# Patient Record
Sex: Female | Born: 1970 | Race: Black or African American | Hispanic: No | Smoking: Never smoker
Health system: Southern US, Community
[De-identification: ages and names within clinical notes are randomized; demographics above are authoritative.]

## PROBLEM LIST (undated history)

## (undated) DIAGNOSIS — T7840XA Allergy, unspecified, initial encounter: Secondary | ICD-10-CM

## (undated) DIAGNOSIS — C801 Malignant (primary) neoplasm, unspecified: Secondary | ICD-10-CM

## (undated) DIAGNOSIS — G43909 Migraine, unspecified, not intractable, without status migrainosus: Secondary | ICD-10-CM

## (undated) DIAGNOSIS — F32A Depression, unspecified: Secondary | ICD-10-CM

## (undated) DIAGNOSIS — D649 Anemia, unspecified: Secondary | ICD-10-CM

## (undated) DIAGNOSIS — F419 Anxiety disorder, unspecified: Secondary | ICD-10-CM

## (undated) DIAGNOSIS — K219 Gastro-esophageal reflux disease without esophagitis: Secondary | ICD-10-CM

## (undated) DIAGNOSIS — F329 Major depressive disorder, single episode, unspecified: Secondary | ICD-10-CM

## (undated) HISTORY — DX: Depression, unspecified: F32.A

## (undated) HISTORY — DX: Anemia, unspecified: D64.9

## (undated) HISTORY — DX: Anxiety disorder, unspecified: F41.9

## (undated) HISTORY — PX: OTHER SURGICAL HISTORY: SHX169

## (undated) HISTORY — PX: APPENDECTOMY: SHX54

## (undated) HISTORY — PX: TUBAL LIGATION: SHX77

## (undated) HISTORY — PX: WRIST ARTHROSCOPY: SUR100

## (undated) HISTORY — DX: Migraine, unspecified, not intractable, without status migrainosus: G43.909

## (undated) HISTORY — DX: Major depressive disorder, single episode, unspecified: F32.9

## (undated) HISTORY — PX: CERVICAL CERCLAGE: SHX1329

## (undated) HISTORY — DX: Allergy, unspecified, initial encounter: T78.40XA

---
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
## 1997-08-23 HISTORY — PX: CARPAL TUNNEL RELEASE: SHX101

## 1999-06-17 ENCOUNTER — Encounter: Admission: RE | Admit: 1999-06-17 | Discharge: 1999-06-17 | Payer: Self-pay | Admitting: Internal Medicine

## 1999-06-17 ENCOUNTER — Encounter: Payer: Self-pay | Admitting: Internal Medicine

## 2002-04-16 ENCOUNTER — Other Ambulatory Visit: Admission: RE | Admit: 2002-04-16 | Discharge: 2002-04-16 | Payer: Self-pay | Admitting: Obstetrics and Gynecology

## 2003-05-30 ENCOUNTER — Other Ambulatory Visit: Admission: RE | Admit: 2003-05-30 | Discharge: 2003-05-30 | Payer: Self-pay | Admitting: Obstetrics and Gynecology

## 2003-08-15 ENCOUNTER — Emergency Department (HOSPITAL_COMMUNITY): Admission: AD | Admit: 2003-08-15 | Discharge: 2003-08-15 | Payer: Self-pay | Admitting: Family Medicine

## 2003-12-19 ENCOUNTER — Emergency Department (HOSPITAL_COMMUNITY): Admission: EM | Admit: 2003-12-19 | Discharge: 2003-12-19 | Payer: Self-pay | Admitting: Family Medicine

## 2004-07-19 ENCOUNTER — Emergency Department (HOSPITAL_COMMUNITY): Admission: EM | Admit: 2004-07-19 | Discharge: 2004-07-19 | Payer: Self-pay | Admitting: Family Medicine

## 2004-07-19 ENCOUNTER — Emergency Department (HOSPITAL_COMMUNITY): Admission: EM | Admit: 2004-07-19 | Discharge: 2004-07-20 | Payer: Self-pay | Admitting: Emergency Medicine

## 2004-07-23 ENCOUNTER — Inpatient Hospital Stay (HOSPITAL_COMMUNITY): Admission: RE | Admit: 2004-07-23 | Discharge: 2004-07-27 | Payer: Self-pay | Admitting: Psychiatry

## 2004-07-23 ENCOUNTER — Ambulatory Visit: Payer: Self-pay | Admitting: Psychiatry

## 2004-08-18 ENCOUNTER — Emergency Department (HOSPITAL_COMMUNITY): Admission: EM | Admit: 2004-08-18 | Discharge: 2004-08-18 | Payer: Self-pay | Admitting: Family Medicine

## 2004-08-26 ENCOUNTER — Emergency Department (HOSPITAL_COMMUNITY): Admission: EM | Admit: 2004-08-26 | Discharge: 2004-08-26 | Payer: Self-pay | Admitting: Family Medicine

## 2004-09-01 ENCOUNTER — Emergency Department (HOSPITAL_COMMUNITY): Admission: EM | Admit: 2004-09-01 | Discharge: 2004-09-01 | Payer: Self-pay | Admitting: Emergency Medicine

## 2004-09-04 ENCOUNTER — Emergency Department (HOSPITAL_COMMUNITY): Admission: EM | Admit: 2004-09-04 | Discharge: 2004-09-05 | Payer: Self-pay | Admitting: Emergency Medicine

## 2004-11-12 ENCOUNTER — Emergency Department (HOSPITAL_COMMUNITY): Admission: EM | Admit: 2004-11-12 | Discharge: 2004-11-12 | Payer: Self-pay | Admitting: Family Medicine

## 2005-01-12 ENCOUNTER — Emergency Department (HOSPITAL_COMMUNITY): Admission: EM | Admit: 2005-01-12 | Discharge: 2005-01-12 | Payer: Self-pay | Admitting: Family Medicine

## 2005-01-17 ENCOUNTER — Emergency Department (HOSPITAL_COMMUNITY): Admission: AD | Admit: 2005-01-17 | Discharge: 2005-01-17 | Payer: Self-pay | Admitting: Family Medicine

## 2005-03-18 ENCOUNTER — Emergency Department (HOSPITAL_COMMUNITY): Admission: EM | Admit: 2005-03-18 | Discharge: 2005-03-18 | Payer: Self-pay | Admitting: Emergency Medicine

## 2005-05-02 ENCOUNTER — Emergency Department (HOSPITAL_COMMUNITY): Admission: EM | Admit: 2005-05-02 | Discharge: 2005-05-02 | Payer: Self-pay | Admitting: Emergency Medicine

## 2005-05-07 ENCOUNTER — Emergency Department (HOSPITAL_COMMUNITY): Admission: EM | Admit: 2005-05-07 | Discharge: 2005-05-07 | Payer: Self-pay | Admitting: Family Medicine

## 2005-07-28 ENCOUNTER — Emergency Department (HOSPITAL_COMMUNITY): Admission: EM | Admit: 2005-07-28 | Discharge: 2005-07-28 | Payer: Self-pay | Admitting: Family Medicine

## 2005-10-03 ENCOUNTER — Emergency Department (HOSPITAL_COMMUNITY): Admission: EM | Admit: 2005-10-03 | Discharge: 2005-10-03 | Payer: Self-pay | Admitting: Family Medicine

## 2006-03-27 ENCOUNTER — Emergency Department (HOSPITAL_COMMUNITY): Admission: EM | Admit: 2006-03-27 | Discharge: 2006-03-27 | Payer: Self-pay | Admitting: Family Medicine

## 2006-04-11 ENCOUNTER — Emergency Department (HOSPITAL_COMMUNITY): Admission: EM | Admit: 2006-04-11 | Discharge: 2006-04-11 | Payer: Self-pay | Admitting: Emergency Medicine

## 2006-06-15 ENCOUNTER — Emergency Department (HOSPITAL_COMMUNITY): Admission: EM | Admit: 2006-06-15 | Discharge: 2006-06-15 | Payer: Self-pay | Admitting: Emergency Medicine

## 2006-08-04 ENCOUNTER — Emergency Department (HOSPITAL_COMMUNITY): Admission: EM | Admit: 2006-08-04 | Discharge: 2006-08-04 | Payer: Self-pay | Admitting: Family Medicine

## 2006-08-12 ENCOUNTER — Emergency Department (HOSPITAL_COMMUNITY): Admission: EM | Admit: 2006-08-12 | Discharge: 2006-08-13 | Payer: Self-pay | Admitting: Emergency Medicine

## 2006-12-19 ENCOUNTER — Other Ambulatory Visit (HOSPITAL_COMMUNITY): Admission: RE | Admit: 2006-12-19 | Discharge: 2007-03-19 | Payer: Self-pay | Admitting: Psychiatry

## 2006-12-19 ENCOUNTER — Ambulatory Visit: Payer: Self-pay | Admitting: Psychiatry

## 2006-12-22 ENCOUNTER — Emergency Department (HOSPITAL_COMMUNITY): Admission: EM | Admit: 2006-12-22 | Discharge: 2006-12-22 | Payer: Self-pay | Admitting: Emergency Medicine

## 2007-02-28 ENCOUNTER — Emergency Department (HOSPITAL_COMMUNITY): Admission: EM | Admit: 2007-02-28 | Discharge: 2007-02-28 | Payer: Self-pay | Admitting: Emergency Medicine

## 2007-07-03 ENCOUNTER — Emergency Department (HOSPITAL_COMMUNITY): Admission: EM | Admit: 2007-07-03 | Discharge: 2007-07-03 | Payer: Self-pay | Admitting: Family Medicine

## 2007-08-20 ENCOUNTER — Emergency Department (HOSPITAL_COMMUNITY): Admission: EM | Admit: 2007-08-20 | Discharge: 2007-08-20 | Payer: Self-pay | Admitting: Emergency Medicine

## 2007-09-20 ENCOUNTER — Emergency Department (HOSPITAL_COMMUNITY): Admission: EM | Admit: 2007-09-20 | Discharge: 2007-09-20 | Payer: Self-pay | Admitting: Emergency Medicine

## 2007-09-21 ENCOUNTER — Emergency Department (HOSPITAL_COMMUNITY): Admission: EM | Admit: 2007-09-21 | Discharge: 2007-09-21 | Payer: Self-pay | Admitting: Emergency Medicine

## 2007-10-26 ENCOUNTER — Emergency Department (HOSPITAL_COMMUNITY): Admission: EM | Admit: 2007-10-26 | Discharge: 2007-10-26 | Payer: Self-pay | Admitting: Family Medicine

## 2008-01-23 ENCOUNTER — Emergency Department (HOSPITAL_COMMUNITY): Admission: EM | Admit: 2008-01-23 | Discharge: 2008-01-23 | Payer: Self-pay | Admitting: Emergency Medicine

## 2008-03-14 ENCOUNTER — Emergency Department (HOSPITAL_COMMUNITY): Admission: EM | Admit: 2008-03-14 | Discharge: 2008-03-14 | Payer: Self-pay | Admitting: Family Medicine

## 2008-03-15 ENCOUNTER — Emergency Department (HOSPITAL_COMMUNITY): Admission: EM | Admit: 2008-03-15 | Discharge: 2008-03-15 | Payer: Self-pay | Admitting: *Deleted

## 2008-03-19 ENCOUNTER — Emergency Department (HOSPITAL_COMMUNITY): Admission: EM | Admit: 2008-03-19 | Discharge: 2008-03-19 | Payer: Self-pay | Admitting: Family Medicine

## 2008-06-05 ENCOUNTER — Emergency Department (HOSPITAL_COMMUNITY): Admission: EM | Admit: 2008-06-05 | Discharge: 2008-06-05 | Payer: Self-pay | Admitting: Family Medicine

## 2008-09-23 ENCOUNTER — Emergency Department (HOSPITAL_COMMUNITY): Admission: EM | Admit: 2008-09-23 | Discharge: 2008-09-23 | Payer: Self-pay | Admitting: Family Medicine

## 2008-11-03 ENCOUNTER — Emergency Department (HOSPITAL_COMMUNITY): Admission: EM | Admit: 2008-11-03 | Discharge: 2008-11-03 | Payer: Self-pay | Admitting: Family Medicine

## 2008-12-26 ENCOUNTER — Emergency Department (HOSPITAL_COMMUNITY): Admission: EM | Admit: 2008-12-26 | Discharge: 2008-12-26 | Payer: Self-pay | Admitting: Emergency Medicine

## 2009-04-11 ENCOUNTER — Emergency Department (HOSPITAL_COMMUNITY): Admission: EM | Admit: 2009-04-11 | Discharge: 2009-04-11 | Payer: Self-pay | Admitting: Emergency Medicine

## 2009-08-09 ENCOUNTER — Emergency Department (HOSPITAL_COMMUNITY): Admission: EM | Admit: 2009-08-09 | Discharge: 2009-08-09 | Payer: Self-pay | Admitting: Family Medicine

## 2009-09-09 ENCOUNTER — Emergency Department (HOSPITAL_COMMUNITY): Admission: EM | Admit: 2009-09-09 | Discharge: 2009-09-09 | Payer: Self-pay | Admitting: Emergency Medicine

## 2009-09-15 ENCOUNTER — Emergency Department (HOSPITAL_COMMUNITY): Admission: EM | Admit: 2009-09-15 | Discharge: 2009-09-16 | Payer: Self-pay | Admitting: Emergency Medicine

## 2010-01-03 ENCOUNTER — Emergency Department (HOSPITAL_COMMUNITY)
Admission: EM | Admit: 2010-01-03 | Discharge: 2010-01-03 | Payer: Self-pay | Source: Home / Self Care | Admitting: Family Medicine

## 2010-05-22 ENCOUNTER — Emergency Department (HOSPITAL_COMMUNITY): Admission: EM | Admit: 2010-05-22 | Discharge: 2010-05-22 | Payer: Self-pay | Admitting: Family Medicine

## 2010-05-23 ENCOUNTER — Emergency Department (HOSPITAL_COMMUNITY): Admission: EM | Admit: 2010-05-23 | Discharge: 2010-05-23 | Payer: Self-pay | Admitting: Emergency Medicine

## 2010-05-27 ENCOUNTER — Emergency Department (HOSPITAL_COMMUNITY): Admission: EM | Admit: 2010-05-27 | Discharge: 2010-05-27 | Payer: Self-pay | Admitting: Emergency Medicine

## 2010-09-05 ENCOUNTER — Emergency Department (HOSPITAL_COMMUNITY)
Admission: EM | Admit: 2010-09-05 | Discharge: 2010-09-05 | Payer: Self-pay | Source: Home / Self Care | Admitting: Emergency Medicine

## 2010-11-04 LAB — POCT I-STAT, CHEM 8
BUN: 9 mg/dL (ref 6–23)
Calcium, Ion: 1.11 mmol/L — ABNORMAL LOW (ref 1.12–1.32)
Chloride: 104 mEq/L (ref 96–112)
Creatinine, Ser: 1 mg/dL (ref 0.4–1.2)
Glucose, Bld: 98 mg/dL (ref 70–99)
HCT: 37 % (ref 36.0–46.0)
Hemoglobin: 12.6 g/dL (ref 12.0–15.0)
Potassium: 3.4 mEq/L — ABNORMAL LOW (ref 3.5–5.1)
Sodium: 139 mEq/L (ref 135–145)
TCO2: 25 mmol/L (ref 0–100)

## 2010-11-05 LAB — WET PREP, GENITAL
WBC, Wet Prep HPF POC: NONE SEEN
Yeast Wet Prep HPF POC: NONE SEEN

## 2010-11-05 LAB — GC/CHLAMYDIA PROBE AMP, GENITAL: GC Probe Amp, Genital: NEGATIVE
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
## 2011-01-21 ENCOUNTER — Inpatient Hospital Stay (INDEPENDENT_AMBULATORY_CARE_PROVIDER_SITE_OTHER)
Admission: RE | Admit: 2011-01-21 | Discharge: 2011-01-21 | Disposition: A | Discharge status: Home / Self Care | Payer: 59 | Source: Ambulatory Visit | Attending: Family Medicine | Admitting: Family Medicine

## 2011-01-21 DIAGNOSIS — G43009 Migraine without aura, not intractable, without status migrainosus: Secondary | ICD-10-CM

## 2011-02-09 ENCOUNTER — Ambulatory Visit: Payer: 59 | Admitting: Gynecology

## 2011-02-16 ENCOUNTER — Ambulatory Visit: Payer: 59 | Admitting: Gynecology

## 2011-02-17 ENCOUNTER — Ambulatory Visit (INDEPENDENT_AMBULATORY_CARE_PROVIDER_SITE_OTHER): Payer: 59 | Admitting: Gynecology

## 2011-02-17 ENCOUNTER — Other Ambulatory Visit: Payer: Self-pay | Admitting: Gynecology

## 2011-02-17 DIAGNOSIS — R87810 Cervical high risk human papillomavirus (HPV) DNA test positive: Secondary | ICD-10-CM

## 2011-02-17 DIAGNOSIS — N920 Excessive and frequent menstruation with regular cycle: Secondary | ICD-10-CM

## 2011-02-17 DIAGNOSIS — D259 Leiomyoma of uterus, unspecified: Secondary | ICD-10-CM

## 2011-02-28 ENCOUNTER — Emergency Department (HOSPITAL_COMMUNITY)
Admission: EM | Admit: 2011-02-28 | Discharge: 2011-02-28 | Disposition: A | Discharge status: Home / Self Care | Payer: 59 | Attending: Emergency Medicine | Admitting: Emergency Medicine

## 2011-02-28 DIAGNOSIS — F329 Major depressive disorder, single episode, unspecified: Secondary | ICD-10-CM | POA: Insufficient documentation

## 2011-02-28 DIAGNOSIS — R51 Headache: Secondary | ICD-10-CM | POA: Insufficient documentation

## 2011-02-28 DIAGNOSIS — F3289 Other specified depressive episodes: Secondary | ICD-10-CM | POA: Insufficient documentation

## 2011-03-04 ENCOUNTER — Other Ambulatory Visit: Payer: 59

## 2011-03-04 ENCOUNTER — Ambulatory Visit (INDEPENDENT_AMBULATORY_CARE_PROVIDER_SITE_OTHER): Payer: 59 | Admitting: Gynecology

## 2011-03-04 DIAGNOSIS — N949 Unspecified condition associated with female genital organs and menstrual cycle: Secondary | ICD-10-CM

## 2011-03-04 DIAGNOSIS — N852 Hypertrophy of uterus: Secondary | ICD-10-CM

## 2011-03-04 DIAGNOSIS — D251 Intramural leiomyoma of uterus: Secondary | ICD-10-CM

## 2011-03-04 DIAGNOSIS — D252 Subserosal leiomyoma of uterus: Secondary | ICD-10-CM

## 2011-03-04 DIAGNOSIS — N938 Other specified abnormal uterine and vaginal bleeding: Secondary | ICD-10-CM

## 2011-03-04 DIAGNOSIS — N946 Dysmenorrhea, unspecified: Secondary | ICD-10-CM

## 2011-03-04 DIAGNOSIS — N831 Corpus luteum cyst of ovary, unspecified side: Secondary | ICD-10-CM

## 2011-03-04 DIAGNOSIS — D259 Leiomyoma of uterus, unspecified: Secondary | ICD-10-CM

## 2011-03-04 DIAGNOSIS — N8300 Follicular cyst of ovary, unspecified side: Secondary | ICD-10-CM

## 2011-03-04 DIAGNOSIS — N920 Excessive and frequent menstruation with regular cycle: Secondary | ICD-10-CM

## 2011-05-21 LAB — POCT I-STAT, CHEM 8
Hemoglobin: 13.6
Sodium: 138
TCO2: 24

## 2011-05-21 LAB — POCT PREGNANCY, URINE: Preg Test, Ur: NEGATIVE

## 2011-06-16 ENCOUNTER — Telehealth: Payer: Self-pay | Admitting: *Deleted

## 2011-06-16 DIAGNOSIS — N926 Irregular menstruation, unspecified: Secondary | ICD-10-CM

## 2011-06-16 MED ORDER — MEGESTROL ACETATE 40 MG PO TABS: 40.0000 mg | ORAL_TABLET | Freq: Two times a day (BID) | ORAL | Status: AC

## 2011-06-16 NOTE — Telephone Encounter (Signed)
Pt called complaining of irregular bleeding x 2 months now. Pt states she would bleed for 1 week then not bleed for another 2 weeks, then bleed for 8 days. Last normal period was sometime in august. Bleeding started again yesterday, she is taking lysteda 650 mg 2 tablets 3 times a day for 5 days. No birth control, the bleeding is heavy in beginning  then becomes very light. Pt can't make office until possibly next month.Please advise

## 2011-06-16 NOTE — Telephone Encounter (Signed)
Patient does need to make an appointment for further evaluation of her dysfunctional uterine bleeding she will need an endometrial biopsy to rule out any malignancy. She did have an ultrasound July 12 which demonstrated she had approximately 8 intramural or subserous leiomyoma. Her sonohysterogram had demonstrated no intracavitary defect. She will need to come to the office to do this procedure and plan a course of management. I will give her Megace 40 mg one tablet twice a day for 2 weeks and stop her bleeding until she gets into the office for the appropriate biopsy and evaluation.

## 2011-06-16 NOTE — Telephone Encounter (Signed)
Pt informed with the below note,tx to appointment desk to make appointment, rx sent to pharmacy.

## 2011-06-17 ENCOUNTER — Ambulatory Visit: Payer: Self-pay | Admitting: Gastroenterology

## 2011-06-29 ENCOUNTER — Ambulatory Visit: Payer: Self-pay | Admitting: Gastroenterology

## 2011-07-01 ENCOUNTER — Ambulatory Visit (INDEPENDENT_AMBULATORY_CARE_PROVIDER_SITE_OTHER): Payer: 59 | Admitting: Gynecology

## 2011-07-01 ENCOUNTER — Encounter: Payer: Self-pay | Admitting: Gynecology

## 2011-07-01 VITALS — BP 132/80

## 2011-07-01 DIAGNOSIS — N938 Other specified abnormal uterine and vaginal bleeding: Secondary | ICD-10-CM | POA: Insufficient documentation

## 2011-07-01 DIAGNOSIS — N949 Unspecified condition associated with female genital organs and menstrual cycle: Secondary | ICD-10-CM

## 2011-07-01 DIAGNOSIS — D259 Leiomyoma of uterus, unspecified: Secondary | ICD-10-CM | POA: Insufficient documentation

## 2011-07-01 NOTE — Progress Notes (Signed)
Patient 40 year old gravida 6 para 2 AB 4 prior tubal ligation procedure presented to the office today for an endometrial biopsy. Patient with history of dysfunction bleeding whereas she would have 2 periods per month. She tried Lysteda in the past with minimal resolution. She had been seen here in the office back in July of this year and had a sonohysterogram the ultrasound demonstrated a measurement 9.5 x 7.3 x 5.9 cm with an endometrial stripe of 8.7 mm several intramural and subserous myoma had been noted total of 9 the largest one measured 24 x 19 mm. Ovaries are otherwise normal patient returned today for an endometrial biopsy and discussed treatment options. 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  Pelvic exam: Bartholin urethra Skene was within normal limits Vagina: No gross lesions on inspection Cervix: No lesions on gross inspection Uterus: 10 weeks size and irregular shaped Adnexa: No palpable masses or tenderness  The cervix was cleansed with Betadine solution and a single-tooth tenaculum was placed on the anterior cervical lip. The cervix sounded to 7 cm and moderate amount of tissue was obtained and was submitted for histological evaluation. Patient was given Aleve at the completion of the procedure. Patient will return back next week to discuss results of the pathology report and plan a course of management. We discussed several options to include a Mirena IUD or low dose oral contraceptive pill or consider endometrial ablation. Due to her factor she's has some any fibroids she would best be served by a laparoscopic hysterectomy with ovarian conservation. Literature information was provided and we'll follow accordingly.

## 2011-07-08 ENCOUNTER — Ambulatory Visit: Payer: 59 | Admitting: Gynecology

## 2011-07-20 ENCOUNTER — Encounter (INDEPENDENT_AMBULATORY_CARE_PROVIDER_SITE_OTHER): Payer: 59 | Admitting: Gynecology

## 2011-07-29 NOTE — Progress Notes (Signed)
Patient did not show up for scheduled visit.

## 2011-09-03 ENCOUNTER — Other Ambulatory Visit (HOSPITAL_COMMUNITY)
Admission: RE | Admit: 2011-09-03 | Discharge: 2011-09-03 | Disposition: A | Discharge status: Home / Self Care | Payer: 59 | Source: Ambulatory Visit | Attending: Gynecology | Admitting: Gynecology

## 2011-09-03 ENCOUNTER — Encounter: Payer: Self-pay | Admitting: Gynecology

## 2011-09-03 ENCOUNTER — Ambulatory Visit (INDEPENDENT_AMBULATORY_CARE_PROVIDER_SITE_OTHER): Payer: 59 | Admitting: Gynecology

## 2011-09-03 VITALS — BP 110/70

## 2011-09-03 DIAGNOSIS — Z01419 Encounter for gynecological examination (general) (routine) without abnormal findings: Secondary | ICD-10-CM | POA: Insufficient documentation

## 2011-09-03 DIAGNOSIS — N760 Acute vaginitis: Secondary | ICD-10-CM

## 2011-09-03 DIAGNOSIS — N898 Other specified noninflammatory disorders of vagina: Secondary | ICD-10-CM

## 2011-09-03 DIAGNOSIS — N946 Dysmenorrhea, unspecified: Secondary | ICD-10-CM

## 2011-09-03 DIAGNOSIS — B9689 Other specified bacterial agents as the cause of diseases classified elsewhere: Secondary | ICD-10-CM

## 2011-09-03 DIAGNOSIS — A499 Bacterial infection, unspecified: Secondary | ICD-10-CM

## 2011-09-03 DIAGNOSIS — N920 Excessive and frequent menstruation with regular cycle: Secondary | ICD-10-CM

## 2011-09-03 DIAGNOSIS — R87810 Cervical high risk human papillomavirus (HPV) DNA test positive: Secondary | ICD-10-CM | POA: Insufficient documentation

## 2011-09-03 DIAGNOSIS — R87610 Atypical squamous cells of undetermined significance on cytologic smear of cervix (ASC-US): Secondary | ICD-10-CM

## 2011-09-03 DIAGNOSIS — IMO0001 Reserved for inherently not codable concepts without codable children: Secondary | ICD-10-CM

## 2011-09-03 LAB — WET PREP FOR TRICH, YEAST, CLUE
Trich, Wet Prep: NONE SEEN
Yeast Wet Prep HPF POC: NONE SEEN

## 2011-09-03 MED ORDER — CLINDAMYCIN PHOSPHATE 2 % VA CREA: 1.0000 | TOPICAL_CREAM | Freq: Every day | VAGINAL | Status: AC

## 2011-09-03 NOTE — Progress Notes (Signed)
The patient was seen in the office on November 8 with the following: Patient 41 year old gravida 6 para 2 AB 4 prior tubal ligation procedure presented to the office today for an endometrial biopsy. Patient with history of dysfunction bleeding whereas she would have 2 periods per month. She tried Lysteda in the past with minimal resolution. She had been seen here in the office back in July of this year and had a sonohysterogram the ultrasound demonstrated a measurement 9.5 x 7.3 x 5.9 cm with an endometrial stripe of 8.7 mm several intramural and subserous myoma had been noted total of 9 the largest one measured 24 x 19 mm. Ovaries are otherwise normal patient returned today for an endometrial biopsy and discussed treatment options. 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 Her pathology report for an endometrial biopsy as follows:  REPORT OF SURGICAL PATHOLOGY FINAL DIAGNOSIS Diagnosis Endometrium, biopsy - INTERVAL TO EARLY SECRETORY PHASE ENDOMETRIUM. - NO HYPERPLASIA OR MALIGNANCY IDENTIFIED  In June of 2012 patient's Pap smear had demonstrated atypical squamous cells of undetermined significance with high-risk HPV. She had undergone extensive colposcopic evaluation with no abnormalities were noted. She had a benign ECC and presented today for followup Pap smear as well and to discuss definitive treatment for her dysmenorrhea and menorrhagia.  Patient would like to proceed with definitive surgery sometime in March we had previously discussed total laparoscopic hysterectomy with ovarian conservation. We also discussed about endometrial ablation such as the her option technique here in the office. Literature information been provided on both subject matters. Patient will be contacting next week with dates and she will let us know which route  she would like to proceed with a we will see her in the office if this for the hysterectomy the week before surgery were for 2 weeks before we will need to start treating her with Prometrium 200 mg orally to thin endometrium prior to the ablation. Pap smear done today. She was having a vaginal discharge and a wet prep demonstrated evidence of bacterial vaginosis and she was placed on Cleocin vaginal cream to apply each bedtime for 5 days.

## 2011-09-03 NOTE — Patient Instructions (Addendum)
Olegario Messier will call you next week to look at dates for hysterectomy in March. She will check with your insurance company as to coverage/deductable for the hysterectomy or the office endometrial ablation depending on what you choose or decide as well. Will call you if pap abnormal.  Bacterial Vaginosis Bacterial vaginosis (BV) is a vaginal infection where the normal balance of bacteria in the vagina is disrupted. The normal balance is then replaced by an overgrowth of certain bacteria. There are several different kinds of bacteria that can cause BV. BV is the most common vaginal infection in women of childbearing age. CAUSES   The cause of BV is not fully understood. BV develops when there is an increase or imbalance of harmful bacteria.   Some activities or behaviors can upset the normal balance of bacteria in the vagina and put women at increased risk including:   Having a new sex partner or multiple sex partners.   Douching.   Using an intrauterine device (IUD) for contraception.   It is not clear what role sexual activity plays in the development of BV. However, women that have never had sexual intercourse are rarely infected with BV.  Women do not get BV from toilet seats, bedding, swimming pools or from touching objects around them.  SYMPTOMS   Grey vaginal discharge.   A fish-like odor with discharge, especially after sexual intercourse.   Itching or burning of the vagina and vulva.   Burning or pain with urination.   Some women have no signs or symptoms at all.  DIAGNOSIS  Your caregiver must examine the vagina for signs of BV. Your caregiver will perform lab tests and look at the sample of vaginal fluid through a microscope. They will look for bacteria and abnormal cells (clue cells), a pH test higher than 4.5, and a positive amine test all associated with BV.  RISKS AND COMPLICATIONS   Pelvic inflammatory disease (PID).   Infections following gynecology surgery.   Developing  HIV.   Developing herpes virus.  TREATMENT  Sometimes BV will clear up without treatment. However, all women with symptoms of BV should be treated to avoid complications, especially if gynecology surgery is planned. Female partners generally do not need to be treated. However, BV may spread between female sex partners so treatment is helpful in preventing a recurrence of BV.   BV may be treated with antibiotics. The antibiotics come in either pill or vaginal cream forms. Either can be used with nonpregnant or pregnant women, but the recommended dosages differ. These antibiotics are not harmful to the baby.   BV can recur after treatment. If this happens, a second round of antibiotics will often be prescribed.   Treatment is important for pregnant women. If not treated, BV can cause a premature delivery, especially for a pregnant woman who had a premature birth in the past. All pregnant women who have symptoms of BV should be checked and treated.   For chronic reoccurrence of BV, treatment with a type of prescribed gel vaginally twice a week is helpful.  HOME CARE INSTRUCTIONS   Finish all medication as directed by your caregiver.   Do not have sex until treatment is completed.   Tell your sexual partner that you have a vaginal infection. They should see their caregiver and be treated if they have problems, such as a mild rash or itching.   Practice safe sex. Use condoms. Only have 1 sex partner.  PREVENTION  Basic prevention steps can help reduce  the risk of upsetting the natural balance of bacteria in the vagina and developing BV:  Do not have sexual intercourse (be abstinent).   Do not douche.   Use all of the medicine prescribed for treatment of BV, even if the signs and symptoms go away.   Tell your sex partner if you have BV. That way, they can be treated, if needed, to prevent reoccurrence.  SEEK MEDICAL CARE IF:   Your symptoms are not improving after 3 days of treatment.    You have increased discharge, pain, or fever.  MAKE SURE YOU:   Understand these instructions.   Will watch your condition.   Will get help right away if you are not doing well or get worse.  FOR MORE INFORMATION  Division of STD Prevention (DSTDP), Centers for Disease Control and Prevention: [employer] American Social Health Association (ASHA): www.ashastd.org  Document Released: 08/09/2005 Document Revised: 04/21/2011 Document Reviewed: 01/30/2009 [facility] Patient Information 2012 [place], [place].

## 2011-09-07 ENCOUNTER — Telehealth: Payer: Self-pay

## 2011-09-07 ENCOUNTER — Other Ambulatory Visit: Payer: Self-pay

## 2011-09-07 DIAGNOSIS — N920 Excessive and frequent menstruation with regular cycle: Secondary | ICD-10-CM

## 2011-09-07 NOTE — Telephone Encounter (Signed)
Per Dr. Manuela Schwartz request I had checked insurance benefits for [facility] and Her Option Ablation and called patient to give her the info.  The [facility] applies to her deductible and her upfront to [facility] is $1189 and she will have additional hospital, anesthesia and pathology bills.  The Her Option Ablation done in the doctors office falls under Physician Office Services and has a $25 copymt.  Patient was informed and said due to the financial impact and time out of work required that [facility] not a good option for her. Would like to schedule ablation asap.  Her menses is due to start any day.  She needs a Friday to minimalize time out of work so we are going to schedule her for Friday Sep 24, 2011 at 2:00pm.  I will call her tomorrow with all the details and to confirm.  She will let me know when menses begins and I will call in Prometrium 200mg  for start on Day 3 of menses.

## 2011-09-08 ENCOUNTER — Telehealth: Payer: Self-pay

## 2011-09-08 MED ORDER — PROGESTERONE MICRONIZED 200 MG PO CAPS: ORAL_CAPSULE | ORAL | Status: AC

## 2011-09-08 NOTE — Telephone Encounter (Signed)
Patient and I had talked yesterday about scheduling Her Option Ablation.  I spoke with u/s and called her back this morning to confirm that Friday, Feb 1 at 2:00pm should be fine.  She will come the day before for preop/laminaria to be inserted.  Her menses began yesterday after we talked and she will start Prometrium tomorrow hs.  I have e-scribed that to her pharmacy to take q d until procedure.  She was instructed regarding need for someone to drive her to and from procedure and also, regarding full bladder for procedure.  She was mailed a sheet with the full bladder instructions and all her appointment dates are on the sheet as well. I answered all her questions and she will call me prn.

## 2011-09-15 ENCOUNTER — Telehealth: Payer: Self-pay | Admitting: *Deleted

## 2011-09-15 NOTE — Telephone Encounter (Signed)
Pt called wanting most recent pap results. Please advise

## 2011-09-15 NOTE — Telephone Encounter (Signed)
The patient in June 2012 had atypical squamous cells on determine significance with high risk HPV on Pap smear. Had a negative colposcopy. Most recent Pap smear here in the office with the following result:  GYNECOLOGIC CYTOLOGY REPORT Adequacy Reason Satisfactory for evaluation, endocervical/transformation zone component PRESENT. Diagnosis LOW GRADE SQUAMOUS INTRAEPITHELIAL LESION: CIN-1/ HPV (LSIL). Theresa Leisure MD  A higher grade abnormality been what was seen on Pap smear last year it was noted. She will be asked to return to the office for followup colposcopy and biopsy and plan a course of management. An appointment to be made within the next week or so.

## 2011-09-16 NOTE — Telephone Encounter (Signed)
Pt is aware of the below note, she will call back to schedule C&B.

## 2011-09-16 NOTE — Telephone Encounter (Signed)
Patient will be instructed to followup with her option endometrial ablation scheduled for February 1 and then at the end of February will followup for colposcopy and biopsy as a result of her recent Pap smear with the following:  The patient in June 2012 had atypical squamous cells on determine significance with high risk HPV on Pap smear. Had a negative colposcopy. Most recent Pap smear here in the office with the following result:  GYNECOLOGIC CYTOLOGY REPORT  Adequacy Reason  Satisfactory for evaluation, endocervical/transformation zone component PRESENT.  Diagnosis  LOW GRADE SQUAMOUS INTRAEPITHELIAL LESION: CIN-1/ HPV (LSIL).  Pecola Leisure MD

## 2011-09-16 NOTE — Telephone Encounter (Signed)
Spoke with pt about the below, she is scheduled to have her option ablation on Feb. 1. Pt wants to know if she has to do both procedures on the same week? Please advise

## 2011-09-23 ENCOUNTER — Encounter: Payer: Self-pay | Admitting: Gynecology

## 2011-09-23 ENCOUNTER — Ambulatory Visit (INDEPENDENT_AMBULATORY_CARE_PROVIDER_SITE_OTHER): Payer: 59 | Admitting: Gynecology

## 2011-09-23 VITALS — BP 116/72

## 2011-09-23 DIAGNOSIS — Z01818 Encounter for other preprocedural examination: Secondary | ICD-10-CM

## 2011-09-23 DIAGNOSIS — N920 Excessive and frequent menstruation with regular cycle: Secondary | ICD-10-CM

## 2011-09-23 MED ORDER — DIAZEPAM 5 MG PO TABS: 5.0000 mg | ORAL_TABLET | Freq: Four times a day (QID) | ORAL | Status: AC | PRN

## 2011-09-23 MED ORDER — OXYCODONE-ACETAMINOPHEN 5-500 MG PO CAPS: 1.0000 | ORAL_CAPSULE | ORAL | Status: AC | PRN

## 2011-09-23 MED ORDER — MISOPROSTOL 200 MCG PO TABS: ORAL_TABLET | ORAL | Status: DC

## 2011-09-23 MED ORDER — AZITHROMYCIN 500 MG PO TABS: ORAL_TABLET | ORAL | Status: DC

## 2011-09-23 NOTE — Patient Instructions (Signed)
Take the Cytotec tablet tonight before going to bed. The Valium is to be taking only if you have anxiety. Did not take the Valium and the Tylox together. You may want to take for Tylox tonight to help his sleep and to help with any potential cramping. Tomorrow before he leaves to the office for the procedure go ahead and take a Tylox. In the morning take the antibiotic which will repeat in 24 hours.

## 2011-09-23 NOTE — Progress Notes (Signed)
Patient presented to the office today for preoperative consultation. Patient scheduled to undergo office endometrial ablation via the her option technique as a result of her dysfunctional uterine bleeding. Patient has had a previous tubal ligation procedure. Her recent endometrial biopsy demonstrated early secretory endometrium no hyperplasia or minute malignancy identified. Her ultrasound demonstrated a uterus that measured 9.5 x 7.3 by this 5.9 cm with an endometrial stripe of 8.7 mm she had several intramural and subserous myoma but a sonohysterogram there was no intracavitary defect or any abnormalities noted. Otherwise both ovaries were normal.  Review of patient's records had demonstrated that in June of 2012 at time of her annual exam her Pap smear demonstrated atypical squamous cells of undetermined significance with high risk HPV. She subsequently underwent extensive colposcopic evaluation with no abnormalities noted and an ECC was obtained which was benign. The plan was for the patient to return back in 6 months for followup Pap smear which she did in January and her Pap smear this time demonstrated low-grade squamous intraepithelial lesion high risk HPV.  Today we discussed both issues above. The plan is to proceed with endometrial ablation tomorrow. She'll return back to the office in 2 weeks for postop visit. In 2 weeks after that we'll repeat her colposcopy again. Examination:  Abdomen: Soft nontender no rebound or guarding Pelvic Bartholin urethra Skene glands: Within normal limits Vagina: No gross lesions on inspection Cervix: No lesions or discharge Uterus anteverted irregular shaped approximately 10 week size Adnexa: No palpable masses or tenderness Rectal exam: Not done  After outlining the her option endometrial ablation technique the risk benefits and pros and cons were outlined and consent form was signed. The cervix was cleansed with Betadine solution and a laminaria was place  intracervically. She was prescribe Cytotec 200 mcg to take tonight has a cervical ripening agent. She will take a Zithromax 500 mg before she comes to the office tomorrow which she will repeat in 24 hours. She's also given a prescription Tylox to take one by mouth every 4-6 hours when necessary. She was also given prescription for Valium 5 mg to take 2-3 times a day as needed for anxiety. All questions were answered we'll follow accordingly.

## 2011-09-24 ENCOUNTER — Telehealth: Payer: Self-pay

## 2011-09-24 ENCOUNTER — Ambulatory Visit (INDEPENDENT_AMBULATORY_CARE_PROVIDER_SITE_OTHER): Payer: 59 | Admitting: Gynecology

## 2011-09-24 ENCOUNTER — Encounter: Payer: Self-pay | Admitting: Gynecology

## 2011-09-24 ENCOUNTER — Ambulatory Visit: Payer: 59

## 2011-09-24 VITALS — BP 110/70 | HR 68

## 2011-09-24 VITALS — BP 110/82 | HR 86

## 2011-09-24 DIAGNOSIS — N920 Excessive and frequent menstruation with regular cycle: Secondary | ICD-10-CM

## 2011-09-24 MED ORDER — DOXYCYCLINE HYCLATE 100 MG PO CAPS: ORAL_CAPSULE | ORAL | Status: DC

## 2011-09-24 MED ORDER — LIDOCAINE HCL (PF) 1 % IJ SOLN: 12.0000 mL | Freq: Once | INTRAMUSCULAR | Status: DC

## 2011-09-24 MED ORDER — KETOROLAC TROMETHAMINE 60 MG/2ML IM SOLN: 60.0000 mg | Freq: Once | INTRAMUSCULAR | Status: AC

## 2011-09-24 NOTE — Progress Notes (Signed)
Patient Name:Theresa Miranda  Patient MRN: [id number]   Date:09/24/2011   Diagnosis:  Excessive Uterine Bleeding/Menorrhagia  Procedure:  Endometrial cryoablation with intraoperative ultrasonic guidance  Procedure Medications: Toradol 60 mg IM   Procedure:  The Patient was brought to the treatment room having previously been counseled for the procedure position and a speculum was inserted.  The cervix and upper vagina were cleaned with Betadine.  A single tooth tenaculum was placed on the anterior lip of the cervix.  A paracervical block was placed per above.  The uterus was sounded 8 cm.  Cervical dilation was not performed.  Under ultrasound guidance, the Her Option probe was introduced into the uterine cavity after the pre procedural sequence was performed.  After assuring proper cornual placement, Cryoablation was then performed under continuous ultrasound guidance monitoring the growth of the cryozone.  Sequential cryoablation were performed in the following order, locations, freeze times and post freeze myometrial depths.           Location of Freeze Length of Time Depth 1. right    6 Minutes  19 mm 2. left    6 Minutes  16 mm 3. 0    0 Minutes  0 mm 4. 0    0 Minutes  0 mm  Upon completion of the procedure, the instruments were removed, hemostasis visualized and the patient was assisted to the bathroom and then another exam room where she was observed.  Vitals:   Pre treatment:  Time 1324 4 PM: BP: 110/82  P: 86 Post Treatment:  Time 1445 PM  BP: 116/80  P: 72  The patient tolerated the procedure well and was released in stable condition with her driver along with a copy of the post procedure instruments which were reviewed with her.  She is to return to the office in 2 weeks for a post procedural check.  [facility] HMD4:25 PMTD@

## 2011-09-24 NOTE — Telephone Encounter (Signed)
Patient reports allergy to Zithromax.  They are asking for alternative antibiotic.  Patient scheduled for Her Option Ablation today at 2:00pm

## 2011-09-24 NOTE — Telephone Encounter (Signed)
Pharmacist informed that Dr. Glenetta Hew said patient had told him she takes Z-pack with no problems and that is why he presribed Zithromax.  RX was changed to Vibramycin. Pt informed and she said she had take Z-pack fine but pharmacist mentioned her Clindamycin allergy and told her RX was in same family and she was concerned.  I told her Vibramycin has been called in and she can pick it up now.  I reminded her, per Dr. Glenetta Hew, to take her pain medication before she leaves home to come here for procedure.

## 2011-10-07 ENCOUNTER — Emergency Department (HOSPITAL_COMMUNITY)
Admission: EM | Admit: 2011-10-07 | Discharge: 2011-10-07 | Disposition: A | Discharge status: Home / Self Care | Payer: 59 | Source: Home / Self Care

## 2011-10-07 ENCOUNTER — Encounter (HOSPITAL_COMMUNITY): Payer: Self-pay | Admitting: Emergency Medicine

## 2011-10-07 DIAGNOSIS — G43909 Migraine, unspecified, not intractable, without status migrainosus: Secondary | ICD-10-CM

## 2011-10-07 MED ORDER — ONDANSETRON 4 MG PO TBDP
ORAL_TABLET | ORAL | Status: AC
  Filled 2011-10-07: qty 2

## 2011-10-07 MED ORDER — ONDANSETRON 4 MG PO TBDP
8.0000 mg | ORAL_TABLET | Freq: Once | ORAL | Status: AC
  Administered 2011-10-07: 8 mg via ORAL

## 2011-10-07 MED ORDER — HYDROMORPHONE HCL PF 1 MG/ML IJ SOLN
INTRAMUSCULAR | Status: AC
  Filled 2011-10-07: qty 1

## 2011-10-07 MED ORDER — HYDROMORPHONE HCL 1 MG/ML IJ SOLN
1.0000 mg | Freq: Once | INTRAMUSCULAR | Status: AC
  Administered 2011-10-07: 1 mg via INTRAMUSCULAR

## 2011-10-07 NOTE — Discharge Instructions (Signed)
Home to rest.  If your headache persists despite the treatment you received this evening, or worsens, go to the ER. If your headaches continue to be recurrent follow up with your neurologist.

## 2011-10-07 NOTE — ED Notes (Signed)
HERE WITH WHOLE RIGHT SIDE OF FACE PAIN S/P MIGRAINE H/A THAT STARTED X 4 DYS AGO UNRELIEVED BY PRESCRIBED MED MAXALT/BC POWDER AND NAUSEA.PT HAS HX MIGRAINES AND STATES SHE HASN'T SEEN NEURO MD IN MNTHS.PT REPORTS SHE HAS BEEN TAKING PROGESTERONE X 2WEEKS AND POSS SIDE EFFECT

## 2011-10-07 NOTE — ED Provider Notes (Signed)
Medical screening examination/treatment/procedure(s) were performed by non-physician practitioner and as supervising physician I was immediately available for consultation/collaboration.  Raynald Blend, MD 10/07/11 1956

## 2011-10-07 NOTE — ED Provider Notes (Signed)
History     CSN: [id number]  Arrival date & time 10/07/11  1747   None     Chief Complaint  Patient presents with  . Migraine    (Consider location/radiation/quality/duration/timing/severity/associated sxs/prior treatment) HPI Comments: Onset of typical migraine a couple of weeks ago. Pt states the migraine began while she was taking Progesterone and she thinks this may have triggered the HA. It has been waxing and waning. She sometimes gets relief in the last couple of weeks with otc pain relievers or Maxalt but then the HA returns. The headache is typical for her migraine, Rt sided, aching, with flashing lights in her Rt vision and nausea. No vomiting, or paresthesias. She also has photophobia and phonophobia.    Past Medical History  Diagnosis Date  . Depression   . Migraines     Past Surgical History  Procedure Date  . Tonsillectomy and adenoidectomy 1979  . Carpal tunnel release 1999  . Cervical cerclage 1991, 1996  . Tubal ligation     Family History  Problem Relation Age of Onset  . Cancer Maternal Grandmother     lung  . Cancer Maternal Grandfather     prostate  . Alcohol abuse Mother   . Cancer Paternal Grandmother     lung  . Stroke Paternal Grandfather     History  Substance Use Topics  . Smoking status: Never Smoker   . Smokeless tobacco: Never Used  . Alcohol Use: Yes     OCC    OB History    Grav Para Term Preterm Abortions TAB SAB Ect Mult Living                  Review of Systems  Constitutional: Negative for fever, chills and fatigue.  HENT: Negative for ear pain, sore throat, rhinorrhea, neck pain, neck stiffness and sinus pressure.   Eyes: Positive for photophobia and visual disturbance. Negative for pain.  Respiratory: Negative for cough.   Cardiovascular: Negative for chest pain.  Gastrointestinal: Positive for nausea. Negative for vomiting.  Neurological: Positive for headaches. Negative for dizziness and numbness.    Allergies   Clindamycin/lincomycin and Zithromax  Home Medications   Current Outpatient Rx  Name Route Sig Dispense Refill  . RIZATRIPTAN BENZOATE 10 MG PO TBDP Oral Take 10 mg by mouth as needed. May repeat in 2 hours if needed       BP 124/71  Pulse 74  Temp(Src) 98.3 F (36.8 C) (Oral)  Resp 14  SpO2 100%  LMP 10/04/2011  Physical Exam  Nursing note and vitals reviewed. Constitutional: She appears well-developed and well-nourished.       Pt in darkened exam room  HENT:  Head: Normocephalic and atraumatic.  Right Ear: Tympanic membrane, external ear and ear canal normal.  Left Ear: Tympanic membrane, external ear and ear canal normal.  Nose: Nose normal.  Mouth/Throat: Uvula is midline, oropharynx is clear and moist and mucous membranes are normal. No oropharyngeal exudate, posterior oropharyngeal edema or posterior oropharyngeal erythema.  Eyes: Conjunctivae, EOM and lids are normal. Pupils are equal, round, and reactive to light.  Fundoscopic exam:      The right eye shows no hemorrhage and no papilledema.       The left eye shows no hemorrhage and no papilledema.  Neck: Neck supple.  Cardiovascular: Normal rate, regular rhythm and normal heart sounds.   Pulses:      Radial pulses are 2+ on the right side, and 2+ on the  left side.  Pulmonary/Chest: Effort normal and breath sounds normal. No respiratory distress.  Lymphadenopathy:    She has no cervical adenopathy.  Neurological: She is alert. She has normal strength. No cranial nerve deficit.  Reflex Scores:      Bicep reflexes are 2+ on the right side and 2+ on the left side. Skin: Skin is warm and dry.  Psychiatric: She has a normal mood and affect.    ED Course  Procedures (including critical care time)  Labs Reviewed - No data to display No results found.   1. Migraine       MDM  Typical migraine HA for pt, unrelieved with rx and otc medication. Exam neg.         Melody Comas, [place] 10/07/11 1939

## 2011-10-08 ENCOUNTER — Ambulatory Visit (INDEPENDENT_AMBULATORY_CARE_PROVIDER_SITE_OTHER): Payer: 59 | Admitting: Gynecology

## 2011-10-08 ENCOUNTER — Encounter: Payer: Self-pay | Admitting: Gynecology

## 2011-10-08 VITALS — BP 124/86

## 2011-10-08 DIAGNOSIS — Z9889 Other specified postprocedural states: Secondary | ICD-10-CM

## 2011-10-08 DIAGNOSIS — G43909 Migraine, unspecified, not intractable, without status migrainosus: Secondary | ICD-10-CM

## 2011-10-08 MED ORDER — CLINDAMYCIN PHOSPHATE 2 % VA CREA: 1.0000 | TOPICAL_CREAM | Freq: Every day | VAGINAL | Status: DC

## 2011-10-08 MED ORDER — ELETRIPTAN HYDROBROMIDE 40 MG PO TABS: 40.0000 mg | ORAL_TABLET | ORAL | Status: DC | PRN

## 2011-10-08 NOTE — Patient Instructions (Signed)
Patient information: Management of atypical squamous cells (ASC-US and ASC-H) and low grade cervical squamous intraepithelial lesions (LSIL) (Beyond the Basics)  Authors Anne Kathryn Goodman, MD Christine H Holschneider, MD Section Editor Barbara Goff, MD Deputy Editor Sandy J Falk, MD Disclosures  All topics are updated as new evidence becomes available and our peer review process is complete.  Literature review current through: Jan 2013.  This topic last updated: Oct 04, 2011.  INTRODUCTION -- The cervix and vagina are lined by cells called squamous cells (picture 1). Atypical squamous cells (ASC) is the name given to squamous cells on a Pap smear (also called cervical cytology) that do not have a normal appearance but are not clearly precancerous. Low grade squamous intraepithelial lesions (LSIL, also called low grade cervical intraepithelial neoplasia) refers to cells that appear slightly abnormal. Women who have ASC or LSIL on a Pap smear require further testing because some women with these findings have a precancerous lesion of the cervix. This topic review discusses the management of women with ASC and LSIL. The management of women with high grade squamous intraepithelial lesions (HSIL) and atypical glandular cells (AGC) are discussed in a separate topic review. (See "Patient information: Management of high grade cervical squamous intraepithelial lesions (HSIL) and glandular abnormalities (AGC) (Beyond the Basics)".) ATYPICAL SQUAMOUS CELLS (ASC) -- ASC is subdivided into atypical squamous cells of undetermined significance (ASC-US) and atypical squamous cells, cannot rule out a high grade lesion (ASC-H). The risk of a high-grade precancerous lesion in women with ASC-US is 15 percent and for those with ASC-H, the risk is 38 percent [1]. Cervical cancer screening is recommended starting at age 21 years. Management of abnormal Pap smears performed in women age 20 or younger is discussed below.  (See 'Adolescents' below.) Management of abnormal Pap smears in women who are pregnant is also discussed below. (See 'Pregnant women' below.) Atypical squamous cells of undetermined significance (ASC-US) -- There are three options for evaluation of a single ASC-US result.  Perform HPV testing. This is the preferred follow up for ASC-US. HPV testing is often done at the same time as the Pap smear. This is convenient because the woman does not have to return for a second visit. HPV testing is described in detail in a separate topic review (see "Patient information: Cervical cancer screening (Beyond the Basics)").  Women who test positive for HPV types that are high risk for cervical cancer should have colposcopy because they are at greater risk of having an underlying precancerous lesion.  Women who test negative for HPV are not likely to have cervical precancer. These women should have a repeat Pap smear in one year. In most cases, the ASC-US resolves during this time.  Repeat the Pap smear in six months. If this test is normal, it is repeated once more after another six months until there have been two normal tests in a row; the woman can then return to routine screening. If the woman has a second ASC-US result or a more severe abnormality develops, colposcopy is recommended. (See 'Colposcopy' below.)  Have colposcopy. (See 'Colposcopy' below.) Atypical squamous cells, cannot rule out a high grade lesion (ASC-H) -- ASC-H is more likely than ASC-US to be caused by a precancerous change. This finding requires further evaluation with colposcopy (see 'Colposcopy' below). LOW-GRADE SQUAMOUS LESION (LSIL) -- LSIL is usually caused by mild cellular changes. Further testing with colposcopy and cervical biopsy is almost always recommended for women with LSIL because 12 to 16 percent of   women with LSIL have a precancerous lesion [2,3]. The management of women with LSIL depends upon what is seen with colposcopy and  biopsy (see 'Management after colposcopy' below); most clinicians will delay biopsy until after delivery in pregnant women (see 'Pregnant women' below). Adolescents and are evaluated somewhat differently (see 'Adolescents' below).  COLPOSCOPY -- Colposcopy is an office procedure that allows a clinician to closely examine the cervix. It is commonly performed after an abnormal Pap smear. Colposcopy is performed similar to a pelvic examination, while the woman lies on an exam table. A speculum is used to view the cervix, and the viewing device (called a colposcope) remains outside the woman's body (picture 1). The colposcope magnifies the appearance of the cervix. This allows the clinician to better see the location and size of any abnormalities, and also to see any changes in the capillaries (small blood vessels) on the surface of the cervix.  During colposcopy, a small piece of the abnormal area can be removed (biopsied). Anesthesia (numbing medicine) is not needed because the biopsy causes only mild discomfort or cramping. Some women also need to have a biopsy of the inner cervix during colposcopy; this is called endocervical curettage (ECC). Endocervix refers to the inner cervix and curettage means scraping. Pregnant women should not have ECC because it may disturb the pregnancy. Management after colposcopy -- Most women who have a colposcopy have a biopsy of any abnormal-appearing areas. The biopsy samples are sent to a pathologist who determines if there is any evidence of precancerous changes, termed cervical intraepithelial neoplasia (CIN). These changes are categorized as being mild (CIN 1) or moderate to severe (CIN 2 or 3). CIN 1 biopsy in women with Pap smear results that were ASC-US, ASC-H or LSIL cytology - Follow-up is recommended with either HPV testing at 12 months or a Pap smear at 6 and 12 months. The reason for this recommendation is that CIN 1 is a minor abnormality that usually goes away  over time without treatment. Waiting and repeating testing allows time for the abnormality to resolve and also enables the healthcare provider to identify the few situations in which the abnormality has become more severe. Repeat colposcopy is recommended if the results of the follow-up Pap smear are ASC or greater or if the HPV test is positive. Women with two consecutive negative repeat cytology results or a negative HPV test can resume routine screening.  CIN 1 biopsy in women with Pap smear results that were high-grade SIL (HSIL) or atypical glandular cells-not otherwise specified - Follow-up can be one of three options: (1) Pap smear and colposcopy every six months for a year; (2) re-review of both Pap smear and biopsy results by a pathologist; or (3) a procedure to remove a larger piece of tissue from the cervix (cone biopsy or loop electrosurgical excision procedure [called LEEP, loop, or LLETZ]).  CIN 2 or 3 -- CIN 2 or 3 is usually treated by removing or destroying the abnormal area (using a cone biopsy, LEEP, laser, or freezing procedure [cryotherapy]). The reason for this recommendation is that moderate to severe precancerous abnormalities (CIN 2 or 3) are unlikely to resolve over time without treatment and may progress to cancer if left untreated over a period of years. (See "Patient information: Treatment of precancerous cells of the cervix (Beyond the Basics)".) However, in some cases, treatment can be delayed in adolescents or pregnant women (see 'Adolescents' below and 'Pregnant women' below). SPECIAL CIRCUMSTANCES Pregnant women -- The evaluation and management   of pregnant women is different from non-pregnant women because of the risk that trauma to the cervix could lead to preterm labor or delivery. Atypical squamous cells of undetermined significance (ASC-US) -- Pregnant women with ASC-US are managed in the same manner as other women with ASC-US.  Atypical squamous cells, cannot rule out a  high grade lesion (ASC-H) -- Pregnant women with ASC-H are managed in the same manner as other women with ASC-H.  Low-grade squamous intraepithelial lesion (LSIL) -- For pregnant women with LSIL, the preferred approach is colposcopy. An alternative is to defer colposcopy until six weeks postpartum.  Adolescents -- Cervical cancer screening is recommended starting at age 21 years. If a Pap smear is performed in a women age 20 or less and the result is ASC-US or LSIL, the Pap smear should be repeated in 12 months.   

## 2011-10-08 NOTE — Progress Notes (Addendum)
Patient presented to the office today for 2 weeks postop visit. She is status post endometrial ablation via her option technique. Patient is doing well at slight watery discharge recent Pap smear pending.  Exam: Abdomen: Soft nontender no rebound or guarding  Pelvic: Bartholin urethra Skene was within normal limits Vagina: No gross lesions on inspection no discharge Cervix: No gross lesions on inspection Uterus: Anteverted 8 week size slightly irregular Adnexa: No palpable masses or tenderness Rectal: Not examined  Assessment/plan: Patient 2 weeks status post endometrial ablation via her option technique for menorrhagia doing well. Patient recently seen in urgent care for severe migraine. Her headaches have improved today. But still present. Prescription for Relpax 40 mg to take 1 by mouth may repeat in 2 hours when necessary not to exceed 2 tablets in 24 hours. She scheduled to return back to the office in 2 weeks for colposcopic evaluation as a result of recent Pap smear in January demonstrated low-grade squamous intraepithelial lesion with evidence high risk HPV. Literature information was provided today. Prescription for Cleocin vaginal cream to apply each bedtime for 5 days was provided as well when necessary.   Correction: Due to patient's allergy to clindamycin she will be given a prescription of Flagyl 500 mg twice a day for 5 days.

## 2011-10-08 NOTE — Progress Notes (Signed)
Addended by: Ok Edwards on: 10/08/2011 04:40 PM   Modules accepted: Orders

## 2011-10-27 ENCOUNTER — Ambulatory Visit (INDEPENDENT_AMBULATORY_CARE_PROVIDER_SITE_OTHER): Payer: 59 | Admitting: Gynecology

## 2011-10-27 ENCOUNTER — Encounter: Payer: Self-pay | Admitting: Gynecology

## 2011-10-27 VITALS — BP 118/74

## 2011-10-27 DIAGNOSIS — N870 Mild cervical dysplasia: Secondary | ICD-10-CM

## 2011-10-27 NOTE — Patient Instructions (Signed)
Patient information: Management of atypical squamous cells (ASC-US and ASC-H) and low grade cervical squamous intraepithelial lesions (LSIL) (Beyond the Basics)  Authors Lanna Poche, MD Thayer Ohm, MD Section Editor Alvera Novel, MD Deputy Editor Morton Amy, MD Disclosures  All topics are updated as new evidence becomes available and our peer review process is complete.  Literature review current through: Feb 2013.  This topic last updated: Oct 04, 2011.  INTRODUCTION -- The cervix and vagina are lined by cells called squamous cells (picture 1). Atypical squamous cells (ASC) is the name given to squamous cells on a Pap smear (also called cervical cytology) that do not have a normal appearance but are not clearly precancerous. Low grade squamous intraepithelial lesions (LSIL, also called low grade cervical intraepithelial neoplasia) refers to cells that appear slightly abnormal. Women who have ASC or LSIL on a Pap smear require further testing because some women with these findings have a precancerous lesion of the cervix. This topic review discusses the management of women with ASC and LSIL. The management of women with high grade squamous intraepithelial lesions (HSIL) and atypical glandular cells (AGC) are discussed in a separate topic review. (See "Patient information: Management of high grade cervical squamous intraepithelial lesions (HSIL) and glandular abnormalities (AGC) (Beyond the Basics)".) ATYPICAL SQUAMOUS CELLS (ASC) -- ASC is subdivided into atypical squamous cells of undetermined significance (ASC-US) and atypical squamous cells, cannot rule out a high grade lesion (ASC-H). The risk of a high-grade precancerous lesion in women with ASC-US is 15 percent and for those with ASC-H, the risk is 38 percent [1]. Cervical cancer screening is recommended starting at age 32 years. Management of abnormal Pap smears performed in women age 96 or younger is discussed below.  (See 'Adolescents' below.) Management of abnormal Pap smears in women who are pregnant is also discussed below. (See 'Pregnant women' below.) Atypical squamous cells of undetermined significance (ASC-US) -- There are three options for evaluation of a single ASC-US result.  Perform HPV testing. This is the preferred follow up for ASC-US. HPV testing is often done at the same time as the Pap smear. This is convenient because the woman does not have to return for a second visit. HPV testing is described in detail in a separate topic review (see "Patient information: Cervical cancer screening (Beyond the Basics)").  Women who test positive for HPV types that are high risk for cervical cancer should have colposcopy because they are at greater risk of having an underlying precancerous lesion.  Women who test negative for HPV are not likely to have cervical precancer. These women should have a repeat Pap smear in one year. In most cases, the ASC-US resolves during this time.  Repeat the Pap smear in six months. If this test is normal, it is repeated once more after another six months until there have been two normal tests in a row; the woman can then return to routine screening. If the woman has a second ASC-US result or a more severe abnormality develops, colposcopy is recommended. (See 'Colposcopy' below.)  Have colposcopy. (See 'Colposcopy' below.) Atypical squamous cells, cannot rule out a high grade lesion (ASC-H) -- ASC-H is more likely than ASC-US to be caused by a precancerous change. This finding requires further evaluation with colposcopy (see 'Colposcopy' below). LOW-GRADE SQUAMOUS LESION (LSIL) -- LSIL is usually caused by mild cellular changes. Further testing with colposcopy and cervical biopsy is almost always recommended for women with LSIL because 12 to 16 percent of  women with LSIL have a precancerous lesion [2,3]. The management of women with LSIL depends upon what is seen with colposcopy and  biopsy (see 'Management after colposcopy' below); most clinicians will delay biopsy until after delivery in pregnant women (see 'Pregnant women' below). Adolescents and are evaluated somewhat differently (see 'Adolescents' below).  COLPOSCOPY -- Colposcopy is an office procedure that allows a clinician to closely examine the cervix. It is commonly performed after an abnormal Pap smear. Colposcopy is performed similar to a pelvic examination, while the woman lies on an exam table. A speculum is used to view the cervix, and the viewing device (called a colposcope) remains outside the woman's body (picture 1). The colposcope magnifies the appearance of the cervix. This allows the clinician to better see the location and size of any abnormalities, and also to see any changes in the capillaries (small blood vessels) on the surface of the cervix.  During colposcopy, a small piece of the abnormal area can be removed (biopsied). Anesthesia (numbing medicine) is not needed because the biopsy causes only mild discomfort or cramping. Some women also need to have a biopsy of the inner cervix during colposcopy; this is called endocervical curettage (ECC). Endocervix refers to the inner cervix and curettage means scraping. Pregnant women should not have ECC because it may disturb the pregnancy. Management after colposcopy -- Most women who have a colposcopy have a biopsy of any abnormal-appearing areas. The biopsy samples are sent to a pathologist who determines if there is any evidence of precancerous changes, termed cervical intraepithelial neoplasia (CIN). These changes are categorized as being mild (CIN 1) or moderate to severe (CIN 2 or 3). CIN 1 biopsy in women with Pap smear results that were ASC-US, ASC-H or LSIL cytology - Follow-up is recommended with either HPV testing at 12 months or a Pap smear at 6 and 12 months. The reason for this recommendation is that CIN 1 is a minor abnormality that usually goes away  over time without treatment. Waiting and repeating testing allows time for the abnormality to resolve and also enables the healthcare provider to identify the few situations in which the abnormality has become more severe. Repeat colposcopy is recommended if the results of the follow-up Pap smear are ASC or greater or if the HPV test is positive. Women with two consecutive negative repeat cytology results or a negative HPV test can resume routine screening.  CIN 1 biopsy in women with Pap smear results that were high-grade SIL (HSIL) or atypical glandular cells-not otherwise specified - Follow-up can be one of three options: (1) Pap smear and colposcopy every six months for a year; (2) re-review of both Pap smear and biopsy results by a pathologist; or (3) a procedure to remove a larger piece of tissue from the cervix (cone biopsy or loop electrosurgical excision procedure [called LEEP, loop, or LLETZ]).  CIN 2 or 3 -- CIN 2 or 3 is usually treated by removing or destroying the abnormal area (using a cone biopsy, LEEP, laser, or freezing procedure [cryotherapy]). The reason for this recommendation is that moderate to severe precancerous abnormalities (CIN 2 or 3) are unlikely to resolve over time without treatment and may progress to cancer if left untreated over a period of years. (See "Patient information: Treatment of precancerous cells of the cervix (Beyond the Basics)".) However, in some cases, treatment can be delayed in adolescents or pregnant women (see 'Adolescents' below and 'Pregnant women' below). SPECIAL CIRCUMSTANCES Pregnant women -- The evaluation and management  of pregnant women is different from non-pregnant women because of the risk that trauma to the cervix could lead to preterm labor or delivery. Atypical squamous cells of undetermined significance (ASC-US) -- Pregnant women with ASC-US are managed in the same manner as other women with ASC-US.  Atypical squamous cells, cannot rule out a  high grade lesion (ASC-H) -- Pregnant women with ASC-H are managed in the same manner as other women with ASC-H.  Low-grade squamous intraepithelial lesion (LSIL) -- For pregnant women with LSIL, the preferred approach is colposcopy. An alternative is to defer colposcopy until six weeks postpartum.  Adolescents -- Cervical cancer screening is recommended starting at age 56 years. If a Pap smear is performed in a women age 4 or less and the result is ASC-US or LSIL, the Pap smear should be repeated in 12 months.

## 2011-10-27 NOTE — Progress Notes (Signed)
Patient presented to the office today for followup as a result of her recent abnormal Pap smear. Her past Pap history is as follows:   June of 2012: Atypical squamous cells of undetermined significance with high risk HPV. 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 After application of acetic acid a very small acetowhite area was noted at the 11:00 position which was biopsied along with an ECC. Transformation zone was visualized completely. We discussed the new followup guidelines of low-grade SIL with Pap smears every 6 months for closer surveillance for 2 year. We'll wait for the results of the biopsy and manage accordingly. Literature information was provided on the subject. Monsel solution was used for hemostasis.

## 2011-10-29 ENCOUNTER — Telehealth: Payer: Self-pay | Admitting: *Deleted

## 2011-10-29 NOTE — Telephone Encounter (Signed)
Pt called wanting bx result from 10/27/11. Pt informed results are not back yet.

## 2011-11-22 ENCOUNTER — Encounter (HOSPITAL_COMMUNITY): Payer: Self-pay | Admitting: *Deleted

## 2011-11-22 ENCOUNTER — Emergency Department (HOSPITAL_COMMUNITY)
Admission: EM | Admit: 2011-11-22 | Discharge: 2011-11-22 | Disposition: A | Discharge status: Home / Self Care | Payer: 59 | Attending: Emergency Medicine | Admitting: Emergency Medicine

## 2011-11-22 DIAGNOSIS — G43909 Migraine, unspecified, not intractable, without status migrainosus: Secondary | ICD-10-CM | POA: Insufficient documentation

## 2011-11-22 MED ORDER — DIPHENHYDRAMINE HCL 50 MG/ML IJ SOLN
25.0000 mg | Freq: Once | INTRAMUSCULAR | Status: AC
  Administered 2011-11-22: 25 mg via INTRAVENOUS
  Filled 2011-11-22: qty 1

## 2011-11-22 MED ORDER — HYDROCODONE-ACETAMINOPHEN 5-500 MG PO TABS: 1.0000 | ORAL_TABLET | Freq: Four times a day (QID) | ORAL | Status: AC | PRN

## 2011-11-22 MED ORDER — DEXAMETHASONE SODIUM PHOSPHATE 10 MG/ML IJ SOLN
10.0000 mg | Freq: Once | INTRAMUSCULAR | Status: AC
  Administered 2011-11-22: 10 mg via INTRAVENOUS
  Filled 2011-11-22: qty 1

## 2011-11-22 MED ORDER — METOCLOPRAMIDE HCL 5 MG/ML IJ SOLN
10.0000 mg | Freq: Once | INTRAMUSCULAR | Status: AC
  Administered 2011-11-22: 10 mg via INTRAVENOUS
  Filled 2011-11-22: qty 2

## 2011-11-22 MED ORDER — RIZATRIPTAN BENZOATE 10 MG PO TABS: 10.0000 mg | ORAL_TABLET | ORAL | Status: DC | PRN

## 2011-11-22 MED ORDER — ONDANSETRON HCL 4 MG PO TABS: 4.0000 mg | ORAL_TABLET | Freq: Four times a day (QID) | ORAL | Status: AC

## 2011-11-22 MED ORDER — SODIUM CHLORIDE 0.9 % IV BOLUS (SEPSIS)
1000.0000 mL | Freq: Once | INTRAVENOUS | Status: AC
  Administered 2011-11-22: 1000 mL via INTRAVENOUS

## 2011-11-22 NOTE — Discharge Instructions (Signed)
Theresa Miranda . Followup appointment with [facility] in [facility]. We gave you 23 a Benadryl, Reglan 10, and dexamethasone 10 and your IV tonight. Return to the ER for severe uncontrolled pain nausea vomiting. Migraine Headache A migraine is very bad pain on one or both sides of your head. The cause of a migraine is not always known. A migraine can be triggered or caused by different things, such as:  Alcohol.   Smoking.   Stress.   Periods (menstruation) in women.   Aged cheeses.   Foods or drinks that contain nitrates, glutamate, aspartame, or tyramine.   Lack of sleep.   Chocolate.   Caffeine.   Hunger.   Medicines, such as nitroglycerine (used to treat chest pain), birth control pills, estrogen, and some blood pressure medicines.  HOME CARE  Many medicines can help migraine pain or keep migraines from coming back. Your doctor can help you decide on a medicine or treatment program.   If you or your child gets a migraine, it may help to lie down in a dark, quiet room.   Keep a headache journal. This may help find out what is causing the headaches. For example, write down:   What you eat and drink.   How much sleep you get.   Any change to your diet or medicines.  GET HELP RIGHT AWAY IF:   The medicine does not work.   The pain begins again.   The neck is stiff.   You have trouble seeing.   The muscles are weak or you lose muscle control.   You have new symptoms.   You lose your balance.   You have trouble walking.   You feel faint or pass out.  MAKE SURE YOU:   Understand these instructions.   Will watch this condition.   Will get help right away if you are not doing well or get worse.  Document Released: 05/18/2008 Document Revised: 07/29/2011 Document Reviewed: 04/14/2009 [facility] Patient Information 2012 [place], [place].Migraine Headache A migraine is very bad pain on one or both sides of your head. The cause of a migraine is not always known. A  migraine can be triggered or caused by different things, such as:  Alcohol.   Smoking.   Stress.   Periods (menstruation) in women.   Aged cheeses.   Foods or drinks that contain nitrates, glutamate, aspartame, or tyramine.   Lack of sleep.   Chocolate.   Caffeine.   Hunger.   Medicines, such as nitroglycerine (used to treat chest pain), birth control pills, estrogen, and some blood pressure medicines.  HOME CARE  Many medicines can help migraine pain or keep migraines from coming back. Your doctor can help you decide on a medicine or treatment program.   If you or your child gets a migraine, it may help to lie down in a dark, quiet room.   Keep a headache journal. This may help find out what is causing the headaches. For example, write down:   What you eat and drink.   How much sleep you get.   Any change to your diet or medicines.  GET HELP RIGHT AWAY IF:   The medicine does not work.   The pain begins again.   The neck is stiff.   You have trouble seeing.   The muscles are weak or you lose muscle control.   You have new symptoms.   You lose your balance.   You have trouble walking.   You feel  faint or pass out.  MAKE SURE YOU:   Understand these instructions.   Will watch this condition.   Will get help right away if you are not doing well or get worse.  Document Released: 05/18/2008 Document Revised: 07/29/2011 Document Reviewed: 04/14/2009 [facility] Patient Information 2012 [place], [place].Botox Cosmetic Injections Botox Cosmetic shots (injections) can soften wrinkles on the face. BEFORE THE PROCEDURE  Do not drink alcohol for 1 week before getting the shots.   Ask your doctor about changing or stopping your medicines.  PROCEDURE Your doctor will use a thin needle to put the Botox into your skin or muscles. This takes about 15 minutes. AFTER THE PROCEDURE  It may take 5 to 14 days before your wrinkles start to soften.   If the first  shots did not work, you may need more shots.   Wrinkles usually go away for 4 to 6 months. You can get more shots every 4 to 6 months.  Document Released: 09/11/2010 Document Revised: 07/29/2011 Document Reviewed: 12/15/2010 [facility] Patient Information 2012 [place], [place].Botox Cosmetic Injections Botox Cosmetic shots (injections) can soften wrinkles on the face. BEFORE THE PROCEDURE  Do not drink alcohol for 1 week before getting the shots.   Ask your doctor about changing or stopping your medicines.  PROCEDURE Your doctor will use a thin needle to put the Botox into your skin or muscles. This takes about 15 minutes. AFTER THE PROCEDURE  It may take 5 to 14 days before your wrinkles start to soften.   If the first shots did not work, you may need more shots.   Wrinkles usually go away for 4 to 6 months. You can get more shots every 4 to 6 months.  Document Released: 09/11/2010 Document Revised: 07/29/2011 Document Reviewed: 12/15/2010 [facility] Patient Information 2012 [place], [place].Headaches, Frequently Asked Questions MIGRAINE HEADACHES Q: What is migraine? What causes it? How can I treat it? A: Generally, migraine headaches begin as a dull ache. Then they develop into a constant, throbbing, and pulsating pain. You may experience pain at the temples. You may experience pain at the front or back of one or both sides of the head. The pain is usually accompanied by a combination of:  Nausea.   Vomiting.   Sensitivity to light and noise.  Some people (about 15%) experience an aura (see below) before an attack. The cause of migraine is believed to be chemical reactions in the brain. Treatment for migraine may include over-the-counter or prescription medications. It may also include self-help techniques. These include relaxation training and biofeedback.  Q: What is an aura? A: About 15% of people with migraine get an "aura". This is a sign of neurological symptoms that occur  before a migraine headache. You may see wavy or jagged lines, dots, or flashing lights. You might experience tunnel vision or blind spots in one or both eyes. The aura can include visual or auditory hallucinations (something imagined). It may include disruptions in smell (such as strange odors), taste or touch. Other symptoms include:  Numbness.   A "pins and needles" sensation.   Difficulty in recalling or speaking the correct word.  These neurological events may last as long as 60 minutes. These symptoms will fade as the headache begins. Q: What is a trigger? A: Certain physical or environmental factors can lead to or "trigger" a migraine. These include:  Foods.   Hormonal changes.   Weather.   Stress.  It is important to remember that triggers are different for everyone.  To help prevent migraine attacks, you need to figure out which triggers affect you. Keep a headache diary. This is a good way to track triggers. The diary will help you talk to your healthcare professional about your condition. Q: Does weather affect migraines? A: Bright sunshine, hot, humid conditions, and drastic changes in barometric pressure may lead to, or "trigger," a migraine attack in some people. But studies have shown that weather does not act as a trigger for everyone with migraines. Q: What is the link between migraine and hormones? A: Hormones start and regulate many of your body's functions. Hormones keep your body in balance within a constantly changing environment. The levels of hormones in your body are unbalanced at times. Examples are during menstruation, pregnancy, or menopause. That can lead to a migraine attack. In fact, about three quarters of all women with migraine report that their attacks are related to the menstrual cycle.  Q: Is there an increased risk of stroke for migraine sufferers? A: The likelihood of a migraine attack causing a stroke is very remote. That is not to say that migraine  sufferers cannot have a stroke associated with their migraines. In persons under age 65, the most common associated factor for stroke is migraine headache. But over the course of a person's normal life span, the occurrence of migraine headache may actually be associated with a reduced risk of dying from cerebrovascular disease due to stroke.  Q: What are acute medications for migraine? A: Acute medications are used to treat the pain of the headache after it has started. Examples over-the-counter medications, NSAIDs, ergots, and triptans.  Q: What are the triptans? A: Triptans are the newest class of abortive medications. They are specifically targeted to treat migraine. Triptans are vasoconstrictors. They moderate some chemical reactions in the brain. The triptans work on receptors in your brain. Triptans help to restore the balance of a neurotransmitter called serotonin. Fluctuations in levels of serotonin are thought to be a main cause of migraine.  Q: Are over-the-counter medications for migraine effective? A: Over-the-counter, or "OTC," medications may be effective in relieving mild to moderate pain and associated symptoms of migraine. But you should see your caregiver before beginning any treatment regimen for migraine.  Q: What are preventive medications for migraine? A: Preventive medications for migraine are sometimes referred to as "prophylactic" treatments. They are used to reduce the frequency, severity, and length of migraine attacks. Examples of preventive medications include antiepileptic medications, antidepressants, beta-blockers, calcium channel blockers, and NSAIDs (nonsteroidal anti-inflammatory drugs). Q: Why are anticonvulsants used to treat migraine? A: During the past few years, there has been an increased interest in antiepileptic drugs for the prevention of migraine. They are sometimes referred to as "anticonvulsants". Both epilepsy and migraine may be caused by similar reactions  in the brain.  Q: Why are antidepressants used to treat migraine? A: Antidepressants are typically used to treat people with depression. They may reduce migraine frequency by regulating chemical levels, such as serotonin, in the brain.  Q: What alternative therapies are used to treat migraine? A: The term "alternative therapies" is often used to describe treatments considered outside the scope of conventional Western medicine. Examples of alternative therapy include acupuncture, acupressure, and yoga. Another common alternative treatment is herbal therapy. Some herbs are believed to relieve headache pain. Always discuss alternative therapies with your caregiver before proceeding. Some herbal products contain arsenic and other toxins. TENSION HEADACHES Q: What is a tension-type headache? What causes it? How can I  treat it? A: Tension-type headaches occur randomly. They are often the result of temporary stress, anxiety, fatigue, or anger. Symptoms include soreness in your temples, a tightening band-like sensation around your head (a "vice-like" ache). Symptoms can also include a pulling feeling, pressure sensations, and contracting head and neck muscles. The headache begins in your forehead, temples, or the back of your head and neck. Treatment for tension-type headache may include over-the-counter or prescription medications. Treatment may also include self-help techniques such as relaxation training and biofeedback. CLUSTER HEADACHES Q: What is a cluster headache? What causes it? How can I treat it? A: Cluster headache gets its name because the attacks come in groups. The pain arrives with little, if any, warning. It is usually on one side of the head. A tearing or bloodshot eye and a runny nose on the same side of the headache may also accompany the pain. Cluster headaches are believed to be caused by chemical reactions in the brain. They have been described as the most severe and intense of any headache  type. Treatment for cluster headache includes prescription medication and oxygen. SINUS HEADACHES Q: What is a sinus headache? What causes it? How can I treat it? A: When a cavity in the bones of the face and skull (a sinus) becomes inflamed, the inflammation will cause localized pain. This condition is usually the result of an allergic reaction, a tumor, or an infection. If your headache is caused by a sinus blockage, such as an infection, you will probably have a fever. An x-ray will confirm a sinus blockage. Your caregiver's treatment might include antibiotics for the infection, as well as antihistamines or decongestants.  REBOUND HEADACHES Q: What is a rebound headache? What causes it? How can I treat it? A: A pattern of taking acute headache medications too often can lead to a condition known as "rebound headache." A pattern of taking too much headache medication includes taking it more than 2 days per week or in excessive amounts. That means more than the label or a caregiver advises. With rebound headaches, your medications not only stop relieving pain, they actually begin to cause headaches. Doctors treat rebound headache by tapering the medication that is being overused. Sometimes your caregiver will gradually substitute a different type of treatment or medication. Stopping may be a challenge. Regularly overusing a medication increases the potential for serious side effects. Consult a caregiver if you regularly use headache medications more than 2 days per week or more than the label advises. ADDITIONAL QUESTIONS AND ANSWERS Q: What is biofeedback? A: Biofeedback is a self-help treatment. Biofeedback uses special equipment to monitor your body's involuntary physical responses. Biofeedback monitors:  Breathing.   Pulse.   Heart rate.   Temperature.   Muscle tension.   Brain activity.  Biofeedback helps you refine and perfect your relaxation exercises. You learn to control the physical  responses that are related to stress. Once the technique has been mastered, you do not need the equipment any more. Q: Are headaches hereditary? A: Four out of five (80%) of people that suffer report a family history of migraine. Scientists are not sure if this is genetic or a family predisposition. Despite the uncertainty, a child has a 50% chance of having migraine if one parent suffers. The child has a 75% chance if both parents suffer.  Q: Can children get headaches? A: By the time they reach high school, most young people have experienced some type of headache. Many safe and effective approaches or  medications can prevent a headache from occurring or stop it after it has begun.  Q: What type of doctor should I see to diagnose and treat my headache? A: Start with your primary caregiver. Discuss his or her experience and approach to headaches. Discuss methods of classification, diagnosis, and treatment. Your caregiver may decide to recommend you to a headache specialist, depending upon your symptoms or other physical conditions. Having diabetes, allergies, etc., may require a more comprehensive and inclusive approach to your headache. The National Head[employer] Foundation will provide, upon request, a list of [facility] physician members in your state. Document Released: 10/30/2003 Document Revised: 07/29/2011 Document Reviewed: 04/08/2008 [facility] Patient Information 2012 [place], [place].Headaches, Frequently Asked Questions MIGRAINE HEADACHES Q: What is migraine? What causes it? How can I treat it? A: Generally, migraine headaches begin as a dull ache. Then they develop into a constant, throbbing, and pulsating pain. You may experience pain at the temples. You may experience pain at the front or back of one or both sides of the head. The pain is usually accompanied by a combination of:  Nausea.   Vomiting.   Sensitivity to light and noise.  Some people (about 15%) experience an aura (see below) before an  attack. The cause of migraine is believed to be chemical reactions in the brain. Treatment for migraine may include over-the-counter or prescription medications. It may also include self-help techniques. These include relaxation training and biofeedback.  Q: What is an aura? A: About 15% of people with migraine get an "aura". This is a sign of neurological symptoms that occur before a migraine headache. You may see wavy or jagged lines, dots, or flashing lights. You might experience tunnel vision or blind spots in one or both eyes. The aura can include visual or auditory hallucinations (something imagined). It may include disruptions in smell (such as strange odors), taste or touch. Other symptoms include:  Numbness.   A "pins and needles" sensation.   Difficulty in recalling or speaking the correct word.  These neurological events may last as long as 60 minutes. These symptoms will fade as the headache begins. Q: What is a trigger? A: Certain physical or environmental factors can lead to or "trigger" a migraine. These include:  Foods.   Hormonal changes.   Weather.   Stress.  It is important to remember that triggers are different for everyone. To help prevent migraine attacks, you need to figure out which triggers affect you. Keep a headache diary. This is a good way to track triggers. The diary will help you talk to your healthcare professional about your condition. Q: Does weather affect migraines? A: Bright sunshine, hot, humid conditions, and drastic changes in barometric pressure may lead to, or "trigger," a migraine attack in some people. But studies have shown that weather does not act as a trigger for everyone with migraines. Q: What is the link between migraine and hormones? A: Hormones start and regulate many of your body's functions. Hormones keep your body in balance within a constantly changing environment. The levels of hormones in your body are unbalanced at times. Examples  are during menstruation, pregnancy, or menopause. That can lead to a migraine attack. In fact, about three quarters of all women with migraine report that their attacks are related to the menstrual cycle.  Q: Is there an increased risk of stroke for migraine sufferers? A: The likelihood of a migraine attack causing a stroke is very remote. That is not to say that migraine sufferers cannot  have a stroke associated with their migraines. In persons under age 80, the most common associated factor for stroke is migraine headache. But over the course of a person's normal life span, the occurrence of migraine headache may actually be associated with a reduced risk of dying from cerebrovascular disease due to stroke.  Q: What are acute medications for migraine? A: Acute medications are used to treat the pain of the headache after it has started. Examples over-the-counter medications, NSAIDs, ergots, and triptans.  Q: What are the triptans? A: Triptans are the newest class of abortive medications. They are specifically targeted to treat migraine. Triptans are vasoconstrictors. They moderate some chemical reactions in the brain. The triptans work on receptors in your brain. Triptans help to restore the balance of a neurotransmitter called serotonin. Fluctuations in levels of serotonin are thought to be a main cause of migraine.  Q: Are over-the-counter medications for migraine effective? A: Over-the-counter, or "OTC," medications may be effective in relieving mild to moderate pain and associated symptoms of migraine. But you should see your caregiver before beginning any treatment regimen for migraine.  Q: What are preventive medications for migraine? A: Preventive medications for migraine are sometimes referred to as "prophylactic" treatments. They are used to reduce the frequency, severity, and length of migraine attacks. Examples of preventive medications include antiepileptic medications, antidepressants,  beta-blockers, calcium channel blockers, and NSAIDs (nonsteroidal anti-inflammatory drugs). Q: Why are anticonvulsants used to treat migraine? A: During the past few years, there has been an increased interest in antiepileptic drugs for the prevention of migraine. They are sometimes referred to as "anticonvulsants". Both epilepsy and migraine may be caused by similar reactions in the brain.  Q: Why are antidepressants used to treat migraine? A: Antidepressants are typically used to treat people with depression. They may reduce migraine frequency by regulating chemical levels, such as serotonin, in the brain.  Q: What alternative therapies are used to treat migraine? A: The term "alternative therapies" is often used to describe treatments considered outside the scope of conventional Western medicine. Examples of alternative therapy include acupuncture, acupressure, and yoga. Another common alternative treatment is herbal therapy. Some herbs are believed to relieve headache pain. Always discuss alternative therapies with your caregiver before proceeding. Some herbal products contain arsenic and other toxins. TENSION HEADACHES Q: What is a tension-type headache? What causes it? How can I treat it? A: Tension-type headaches occur randomly. They are often the result of temporary stress, anxiety, fatigue, or anger. Symptoms include soreness in your temples, a tightening band-like sensation around your head (a "vice-like" ache). Symptoms can also include a pulling feeling, pressure sensations, and contracting head and neck muscles. The headache begins in your forehead, temples, or the back of your head and neck. Treatment for tension-type headache may include over-the-counter or prescription medications. Treatment may also include self-help techniques such as relaxation training and biofeedback. CLUSTER HEADACHES Q: What is a cluster headache? What causes it? How can I treat it? A: Cluster headache gets its  name because the attacks come in groups. The pain arrives with little, if any, warning. It is usually on one side of the head. A tearing or bloodshot eye and a runny nose on the same side of the headache may also accompany the pain. Cluster headaches are believed to be caused by chemical reactions in the brain. They have been described as the most severe and intense of any headache type. Treatment for cluster headache includes prescription medication and oxygen. SINUS HEADACHES Q:  What is a sinus headache? What causes it? How can I treat it? A: When a cavity in the bones of the face and skull (a sinus) becomes inflamed, the inflammation will cause localized pain. This condition is usually the result of an allergic reaction, a tumor, or an infection. If your headache is caused by a sinus blockage, such as an infection, you will probably have a fever. An x-ray will confirm a sinus blockage. Your caregiver's treatment might include antibiotics for the infection, as well as antihistamines or decongestants.  REBOUND HEADACHES Q: What is a rebound headache? What causes it? How can I treat it? A: A pattern of taking acute headache medications too often can lead to a condition known as "rebound headache." A pattern of taking too much headache medication includes taking it more than 2 days per week or in excessive amounts. That means more than the label or a caregiver advises. With rebound headaches, your medications not only stop relieving pain, they actually begin to cause headaches. Doctors treat rebound headache by tapering the medication that is being overused. Sometimes your caregiver will gradually substitute a different type of treatment or medication. Stopping may be a challenge. Regularly overusing a medication increases the potential for serious side effects. Consult a caregiver if you regularly use headache medications more than 2 days per week or more than the label advises. ADDITIONAL QUESTIONS AND  ANSWERS Q: What is biofeedback? A: Biofeedback is a self-help treatment. Biofeedback uses special equipment to monitor your body's involuntary physical responses. Biofeedback monitors:  Breathing.   Pulse.   Heart rate.   Temperature.   Muscle tension.   Brain activity.  Biofeedback helps you refine and perfect your relaxation exercises. You learn to control the physical responses that are related to stress. Once the technique has been mastered, you do not need the equipment any more. Q: Are headaches hereditary? A: Four out of five (80%) of people that suffer report a family history of migraine. Scientists are not sure if this is genetic or a family predisposition. Despite the uncertainty, a child has a 50% chance of having migraine if one parent suffers. The child has a 75% chance if both parents suffer.  Q: Can children get headaches? A: By the time they reach high school, most young people have experienced some type of headache. Many safe and effective approaches or medications can prevent a headache from occurring or stop it after it has begun.  Q: What type of doctor should I see to diagnose and treat my headache? A: Start with your primary caregiver. Discuss his or her experience and approach to headaches. Discuss methods of classification, diagnosis, and treatment. Your caregiver may decide to recommend you to a headache specialist, depending upon your symptoms or other physical conditions. Having diabetes, allergies, etc., may require a more comprehensive and inclusive approach to your headache. The National Headache Foundation will provide, upon request, a list of [facility] physician members in your state. Document Released: 10/30/2003 Document Revised: 07/29/2011 Document Reviewed: 04/08/2008 [facility] Patient Information 2012 [place], [place].

## 2011-11-22 NOTE — ED Notes (Signed)
The pt says she has had a headache for the past 3 days with nausea

## 2011-11-22 NOTE — ED Provider Notes (Signed)
History     CSN: [id number]  Arrival date & time 11/22/11  1758   First MD Initiated Contact with Patient 11/22/11 2016      Chief Complaint  Patient presents with  . Migraine    (Consider location/radiation/quality/duration/timing/severity/associated sxs/prior treatment) Patient is a 41 y.o. female presenting with migraine. The history is provided by the patient. No language interpreter was used.  Migraine This is a recurrent problem. The current episode started in the past 7 days. The problem occurs daily. The problem has been unchanged. Associated symptoms include headaches and nausea. Pertinent negatives include no fever, neck pain, numbness, rash, vertigo, vomiting or weakness. Exacerbated by: light and sound. Treatments tried: excedrin, BC headache powder,    patient states she's had a headache on the right side for 3 days. Past medical history of migraines. States that she goes to [facility] in [place] where she is treated for her migraines and they tried Botox injections.Also take maxalt but is out of her meds.   States she has a followup appointment with them next week. pmh migraines and depression.  No SI or HI today.    Past Medical History  Diagnosis Date  . Depression   . Migraines   . Migraine     Past Surgical History  Procedure Date  . Tonsillectomy and adenoidectomy 1979  . Carpal tunnel release 1999  . Cervical cerclage 1991, 1996  . Tubal ligation     Family History  Problem Relation Age of Onset  . Cancer Maternal Grandmother     lung  . Cancer Maternal Grandfather     prostate  . Alcohol abuse Mother   . Cancer Paternal Grandmother     lung  . Stroke Paternal Grandfather     History  Substance Use Topics  . Smoking status: Never Smoker   . Smokeless tobacco: Never Used  . Alcohol Use: Yes     OCC    OB History    Grav Para Term Preterm Abortions TAB SAB Ect Mult Living                  Review of Systems  Constitutional: Negative  for fever.  HENT: Negative for neck pain and neck stiffness.   Gastrointestinal: Positive for nausea. Negative for vomiting.  Skin: Negative for rash.  Neurological: Positive for headaches. Negative for dizziness, vertigo, facial asymmetry, weakness and numbness.  Psychiatric/Behavioral: Negative.     Allergies  Clindamycin/lincomycin and Zithromax  Home Medications   Current Outpatient Rx  Name Route Sig Dispense Refill  . EXCEDRIN PO Oral Take 2 tablets by mouth every 6 (six) hours as needed. For headaches    . BC HEADACHE POWDER PO Oral Take 1 packet by mouth daily as needed. For headaches      BP 123/85  Pulse 66  Temp(Src) 98.2 F (36.8 C) (Oral)  Resp 20  SpO2 98%  Physical Exam  Nursing note and vitals reviewed. Constitutional: She is oriented to person, place, and time. She appears well-developed and well-nourished.  HENT:  Head: Normocephalic and atraumatic.  Eyes: Conjunctivae and EOM are normal. Pupils are equal, round, and reactive to light.  Neck: Normal range of motion. Neck supple.  Cardiovascular: Normal rate.   Pulmonary/Chest: Effort normal.  Abdominal: Soft.  Musculoskeletal: Normal range of motion. She exhibits no edema and no tenderness.  Neurological: She is alert and oriented to person, place, and time. She has normal strength and normal reflexes. She displays normal reflexes. No  cranial nerve deficit or sensory deficit. She exhibits normal muscle tone. Coordination normal. GCS eye subscore is 4. GCS verbal subscore is 5. GCS motor subscore is 6.       Tenderness to R side of head.  Skin: Skin is warm and dry.  Psychiatric: She has a normal mood and affect.    ED Course  Procedures (including critical care time)  Labs Reviewed - No data to display No results found.   No diagnosis found.    MDM  Typical migraine x 3 days to R head.  Neuro in tact.  Out of meds.  Rx for maxalt/vicodin.  Migraine cocktail in ER today with relief.  1 liter NS.  Will follow up at [facility] in [place] this week.         Jethro Bastos, NP 11/23/11 1038

## 2011-11-22 NOTE — ED Notes (Signed)
Gave pt extra blanket and turned down lights

## 2011-11-22 NOTE — ED Notes (Addendum)
Reports rgt-sided h/a, constant, x3 days; hx of migraines; states this pain similar to normal migraines; normally takes combination of Zofran, Vicodin, and Maxalt which relieves pain; however, is out of those meds; has tried Excedrin and BC powder without relief; also endorses nausea and photophobia

## 2011-11-24 NOTE — ED Provider Notes (Signed)
Medical screening examination/treatment/procedure(s) were performed by non-physician practitioner and as supervising physician I was immediately available for consultation/collaboration.   Shelda Jakes, MD 11/24/11 [phone]

## 2011-12-01 ENCOUNTER — Encounter (HOSPITAL_COMMUNITY): Payer: Self-pay | Admitting: *Deleted

## 2011-12-01 ENCOUNTER — Emergency Department (HOSPITAL_COMMUNITY)
Admission: EM | Admit: 2011-12-01 | Discharge: 2011-12-01 | Disposition: A | Discharge status: Home / Self Care | Payer: 59 | Source: Home / Self Care | Attending: Emergency Medicine | Admitting: Emergency Medicine

## 2011-12-01 DIAGNOSIS — G43909 Migraine, unspecified, not intractable, without status migrainosus: Secondary | ICD-10-CM

## 2011-12-01 DIAGNOSIS — J019 Acute sinusitis, unspecified: Secondary | ICD-10-CM

## 2011-12-01 MED ORDER — KETOROLAC TROMETHAMINE 30 MG/ML IJ SOLN
INTRAMUSCULAR | Status: AC
  Filled 2011-12-01: qty 1

## 2011-12-01 MED ORDER — DIPHENHYDRAMINE HCL 50 MG/ML IJ SOLN
INTRAMUSCULAR | Status: AC
  Filled 2011-12-01: qty 1

## 2011-12-01 MED ORDER — DIPHENHYDRAMINE HCL 50 MG/ML IJ SOLN
25.0000 mg | Freq: Once | INTRAMUSCULAR | Status: AC
  Administered 2011-12-01: 25 mg via INTRAMUSCULAR

## 2011-12-01 MED ORDER — KETOROLAC TROMETHAMINE 30 MG/ML IJ SOLN
30.0000 mg | Freq: Once | INTRAMUSCULAR | Status: AC
  Administered 2011-12-01: 30 mg via INTRAMUSCULAR

## 2011-12-01 MED ORDER — RIZATRIPTAN BENZOATE 10 MG PO TABS: 10.0000 mg | ORAL_TABLET | ORAL | Status: DC | PRN

## 2011-12-01 MED ORDER — DEXAMETHASONE SODIUM PHOSPHATE 10 MG/ML IJ SOLN
10.0000 mg | Freq: Once | INTRAMUSCULAR | Status: AC
  Administered 2011-12-01: 10 mg via INTRAMUSCULAR

## 2011-12-01 MED ORDER — DEXAMETHASONE SODIUM PHOSPHATE 10 MG/ML IJ SOLN
INTRAMUSCULAR | Status: AC
  Filled 2011-12-01: qty 1

## 2011-12-01 MED ORDER — AMOXICILLIN-POT CLAVULANATE 875-125 MG PO TABS: 1.0000 | ORAL_TABLET | Freq: Two times a day (BID) | ORAL | Status: AC

## 2011-12-01 NOTE — Discharge Instructions (Signed)
Migraine Headache A migraine headache is an intense, throbbing pain on one or both sides of your head. The exact cause of a migraine headache is not always known. A migraine may be caused when nerves in the brain become irritated and release chemicals that cause swelling within blood vessels, causing pain. Many migraine sufferers have a family history of migraines. Before you get a migraine you may or may not get an aura. An aura is a group of symptoms that can predict the beginning of a migraine. An aura may include:  Visual changes such as:   Flashing lights.   Bright spots or zig-zag lines.   Tunnel vision.   Feelings of numbness.   Trouble talking.   Muscle weakness.  SYMPTOMS  Pain on one or both sides of your head.   Pain that is pulsating or throbbing in nature.   Pain that is severe enough to prevent daily activities.   Pain that is aggravated by any daily physical activity.   Nausea (feeling sick to your stomach), vomiting, or both.   Pain with exposure to bright lights, loud noises, or activity.   General sensitivity to bright lights or loud noises.  MIGRAINE TRIGGERS Examples of triggers of migraine headaches include:   Alcohol.   Smoking.   Stress.   It may be related to menses (female menstruation).   Aged cheeses.   Foods or drinks that contain nitrates, glutamate, aspartame, or tyramine.   Lack of sleep.   Chocolate.   Caffeine.   Hunger.   Medications such as nitroglycerine (used to treat chest pain), birth control pills, estrogen, and some blood pressure medications.  DIAGNOSIS  A migraine headache is often diagnosed based on:  Symptoms.   Physical examination.   A computerized X-ray scan (computed tomography, CT) of your head.  TREATMENT  Medications can help prevent migraines if they are recurrent or should they become recurrent. Your caregiver can help you with a medication or treatment program that will be helpful to you.   Lying  down in a dark, quiet room may be helpful.   Keeping a headache diary may help you find a trend as to what may be triggering your headaches.  SEEK IMMEDIATE MEDICAL CARE IF:   You have confusion, personality changes or seizures.   You have headaches that wake you from sleep.   You have an increased frequency in your headaches.   You have a stiff neck.   You have a loss of vision.   You have muscle weakness.   You start losing your balance or have trouble walking.   You feel faint or pass out.  MAKE SURE YOU:   Understand these instructions.   Will watch your condition.   Will get help right away if you are not doing well or get worse.  Document Released: 08/09/2005 Document Revised: 07/29/2011 Document Reviewed: 03/25/2009 [facility] Patient Information 2012 [place], [place].Sinusitis Sinuses are air pockets within the bones of your face. The growth of bacteria within a sinus leads to infection. The infection prevents the sinuses from draining. This infection is called sinusitis. SYMPTOMS  There will be different areas of pain depending on which sinuses have become infected.  The maxillary sinuses often produce pain beneath the eyes.   Frontal sinusitis may cause pain in the middle of the forehead and above the eyes.  Other problems (symptoms) include:  Toothaches.   Colored, pus-like (purulent) drainage from the nose.   Swelling, warmth, and tenderness over the sinus  areas may be signs of infection.  TREATMENT  Sinusitis is most often determined by an exam.X-rays may be taken. If x-rays have been taken, make sure you obtain your results or find out how you are to obtain them. Your caregiver may give you medications (antibiotics). These are medications that will help kill the bacteria causing the infection. You may also be given a medication (decongestant) that helps to reduce sinus swelling.  HOME CARE INSTRUCTIONS   Only take over-the-counter or prescription medicines  for pain, discomfort, or fever as directed by your caregiver.   Drink extra fluids. Fluids help thin the mucus so your sinuses can drain more easily.   Applying either moist heat or ice packs to the sinus areas may help relieve discomfort.   Use saline nasal sprays to help moisten your sinuses. The sprays can be found at your local drugstore.  SEEK IMMEDIATE MEDICAL CARE IF:  You have a fever.   You have increasing pain, severe headaches, or toothache.   You have nausea, vomiting, or drowsiness.   You develop unusual swelling around the face or trouble seeing.  MAKE SURE YOU:   Understand these instructions.   Will watch your condition.   Will get help right away if you are not doing well or get worse.  Document Released: 08/09/2005 Document Revised: 07/29/2011 Document Reviewed: 03/08/2007 [facility] Patient Information 2012 [place], [place].

## 2011-12-01 NOTE — ED Notes (Signed)
Pt with c/o of headache/congestion/sore throat onset Saturday = taking otc sinus med without relief

## 2011-12-01 NOTE — ED Provider Notes (Signed)
Chief Complaint  Patient presents with  . Headache  . Sore Throat  . Nasal Congestion    History of Present Illness:   The patient is a 41 year old female who presents tonight with headache and sinus symptoms. Her current headache has been going on for about a week. It involves mostly the left maxillary area and is tender to touch. Sometimes she does have migraine like symptoms with nausea, photophobia, and phonophobia as well. She has had a long history of migraine headaches and has been to the emergency room multiple times. She usually finds injections help. For the past week she's had nasal congestion with yellow-green drainage, sore throat, left ear pain. She denies any fever, but is felt somewhat lightheaded. She denies any cough.  Review of Systems:  Other than noted above, the patient denies any of the following symptoms: Systemic:  No fever, chills, fatigue, photophobia, stiff neck. Eye:  No redness, eye pain, discharge, blurred vision, or diplopia. ENT:  No nasal congestion, rhinorrhea, sinus pressure or pain, sneezing, earache, or sore throat.  No jaw claudication. Neuro:  No paresthesias, loss of consciousness, seizure activity, muscle weakness, trouble with coordination or gait, trouble speaking or swallowing. Psych:  No depression, anxiety or trouble sleeping.  PMFSH:  Past medical history, family history, social history, meds, and allergies were reviewed.  Physical Exam:   Vital signs:  BP 124/88  Pulse 76  Temp(Src) 98.6 F (37 C) (Oral)  Resp 17  SpO2 99%  LMP 11/17/2011 General:  Alert and oriented.  In no distress. Eye:  Lids and conjunctivas normal.  PERRL,  Full EOMs.  Fundi benign with normal discs and vessels. ENT:  No cranial or facial tenderness to palpation.  TMs and canals clear.  Nasal mucosa was normal and uncongested without any drainage. No intra oral lesions, pharynx clear, mucous membranes moist, dentition normal. There was tenderness to palpation over the  left maxillary sinus. Neck:  Supple, full ROM, no tenderness to palpation.  No adenopathy or mass. Neuro:  Alert and orented times 3.  Speech was clear, fluent, and appropriate.  Cranial nerves intact. No pronator drift, muscle strength normal. Finger to nose normal.  DTRs 2+ .Station and gait were normal.  Romberg's sign was normal.  Able to perform tandem gait well. Psych:  Normal affect.  Medications given in [facility]:  She was given Decadron 10 mg IM, Toradol 30 mg IM, and Benadryl 25 mg IM. She tolerated these well without any immediate side effects.  Assessment:  The primary encounter diagnosis was Acute sinusitis. A diagnosis of Migraine headache was also pertinent to this visit.  Plan:   1.  The following meds were prescribed:   New Prescriptions   AMOXICILLIN-CLAVULANATE (AUGMENTIN) 875-125 MG PER TABLET    Take 1 tablet by mouth 2 (two) times daily.   RIZATRIPTAN (MAXALT) 10 MG TABLET    Take 1 tablet (10 mg total) by mouth as needed for migraine. May repeat in 2 hours if needed   2.  The patient was instructed in symptomatic care and handouts were given. 3.  The patient was told to return if becoming worse in any way, if no better in 3 or 4 days, and given some red flag symptoms that would indicate earlier return.    Reuben Likes, MD 12/01/11 [phone]

## 2011-12-09 ENCOUNTER — Telehealth (HOSPITAL_COMMUNITY): Payer: Self-pay | Admitting: *Deleted

## 2011-12-09 MED ORDER — FLUCONAZOLE 150 MG PO TABS: 150.0000 mg | ORAL_TABLET | Freq: Once | ORAL | Status: AC

## 2011-12-09 NOTE — ED Notes (Signed)
Pt called via voicemail to request Rx for yeast and Maxalt.  States Dr. Lorenz Coaster told her he was going to prescribe them.  Priscella Mann, RN told pt she would ask Dr. Lorenz Coaster tomorrow.  4/18 Spoke with Dr. Lorenz Coaster.  Advised that he did prescribe the Maxalt and was e-prescribed to the [facility] on [address].  Gave order for Diflucan 150 mg po X 1 dose.  Pt called and made aware.  Rx called to [facility] [place] at [phone].  Pt will pick up.

## 2012-01-24 ENCOUNTER — Emergency Department: Payer: Self-pay | Admitting: Emergency Medicine

## 2012-02-01 ENCOUNTER — Encounter (HOSPITAL_COMMUNITY): Payer: Self-pay | Admitting: *Deleted

## 2012-02-01 ENCOUNTER — Emergency Department (HOSPITAL_COMMUNITY)
Admission: EM | Admit: 2012-02-01 | Discharge: 2012-02-02 | Disposition: A | Discharge status: Home / Self Care | Payer: 59 | Attending: Emergency Medicine | Admitting: Emergency Medicine

## 2012-02-01 DIAGNOSIS — M79609 Pain in unspecified limb: Secondary | ICD-10-CM | POA: Insufficient documentation

## 2012-02-01 DIAGNOSIS — G43909 Migraine, unspecified, not intractable, without status migrainosus: Secondary | ICD-10-CM | POA: Insufficient documentation

## 2012-02-01 DIAGNOSIS — M79661 Pain in right lower leg: Secondary | ICD-10-CM

## 2012-02-01 NOTE — ED Notes (Signed)
Pt in waiting room speaking with mother.

## 2012-02-01 NOTE — ED Notes (Signed)
Headache all day and she has had some numbness in her rt leg this afternoonfrom the knee down

## 2012-02-02 ENCOUNTER — Ambulatory Visit (HOSPITAL_COMMUNITY)
Admission: RE | Admit: 2012-02-02 | Discharge: 2012-02-02 | Disposition: A | Discharge status: Home / Self Care | Payer: 59 | Source: Ambulatory Visit | Attending: Emergency Medicine | Admitting: Emergency Medicine

## 2012-02-02 DIAGNOSIS — R52 Pain, unspecified: Secondary | ICD-10-CM

## 2012-02-02 DIAGNOSIS — M79609 Pain in unspecified limb: Secondary | ICD-10-CM | POA: Insufficient documentation

## 2012-02-02 LAB — POCT I-STAT, CHEM 8
Glucose, Bld: 99 mg/dL (ref 70–99)
HCT: 41 % (ref 36.0–46.0)
Hemoglobin: 13.9 g/dL (ref 12.0–15.0)
Potassium: 3.5 mEq/L (ref 3.5–5.1)
TCO2: 26 mmol/L (ref 0–100)

## 2012-02-02 MED ORDER — KETOROLAC TROMETHAMINE 60 MG/2ML IM SOLN
INTRAMUSCULAR | Status: AC
  Filled 2012-02-02: qty 2

## 2012-02-02 MED ORDER — DIPHENHYDRAMINE HCL 50 MG/ML IJ SOLN
INTRAMUSCULAR | Status: AC
  Filled 2012-02-02: qty 1

## 2012-02-02 MED ORDER — METOCLOPRAMIDE HCL 5 MG/ML IJ SOLN
INTRAMUSCULAR | Status: AC
  Filled 2012-02-02: qty 2

## 2012-02-02 MED ORDER — ENOXAPARIN SODIUM 100 MG/ML ~~LOC~~ SOLN
1.0000 mg/kg | Freq: Once | SUBCUTANEOUS | Status: AC
  Administered 2012-02-02: 90 mg via SUBCUTANEOUS
  Filled 2012-02-02: qty 1

## 2012-02-02 NOTE — ED Notes (Signed)
Pt c/o right sided HA with photophobia since yesterday.  Noticed right numbness in leg and foot after work today.  Feels like pins and needles

## 2012-02-02 NOTE — ED Provider Notes (Signed)
History     CSN: [id number]  Arrival date & time 02/01/12  2213   First MD Initiated Contact with Patient 02/02/12 0200      Chief Complaint  Patient presents with  . Headache    (Consider location/radiation/quality/duration/timing/severity/associated sxs/prior treatment) HPI 41 year old female presents to emergency department complaining of 2 days of migraine headache and right lower leg pain. Patient reports her headache is like her typical migraines right-sided, no photo or phonophobia but has had nausea. Patient reports she normally takes Maxalt for her headaches, but has run out. She has no focal weakness numbness or other neurologic complaints. Patient reports onset of pain in her right lower leg starting today as she drove home from work. Patient reports pain is a heaviness and pressure to her right leg with numbness in her toes. Patient is not on birth control pills, no prior history of DVT or PE in herself or others. She denies any prolonged immobility. She is nonsmoker. Past Medical History  Diagnosis Date  . Depression   . Migraines   . Migraine     Past Surgical History  Procedure Date  . Tonsillectomy and adenoidectomy 1979  . Carpal tunnel release 1999  . Cervical cerclage 1991, 1996  . Tubal ligation     Family History  Problem Relation Age of Onset  . Cancer Maternal Grandmother     lung  . Cancer Maternal Grandfather     prostate  . Alcohol abuse Mother   . Cancer Paternal Grandmother     lung  . Stroke Paternal Grandfather     History  Substance Use Topics  . Smoking status: Never Smoker   . Smokeless tobacco: Never Used  . Alcohol Use: Yes     OCC    OB History    Grav Para Term Preterm Abortions TAB SAB Ect Mult Living                  Review of Systems  All other systems reviewed and are negative.   other than listed in history of present illness  Allergies  Clindamycin/lincomycin and Zithromax  Home Medications   Current  Outpatient Rx  Name Route Sig Dispense Refill  . EXCEDRIN PO Oral Take 2 tablets by mouth every 6 (six) hours as needed. For headaches    . BC HEADACHE POWDER PO Oral Take 1 packet by mouth daily as needed. For headaches    . HYDROCODONE-ACETAMINOPHEN 5-500 MG PO TABS Oral Take 1 tablet by mouth every 6 (six) hours as needed. For pain    . ONDANSETRON HCL 4 MG PO TABS Oral Take 4 mg by mouth every 8 (eight) hours as needed. For nausea    . PSEUDOEPHEDRINE-ACETAMINOPHEN 30-500 MG PO TABS Oral Take 1 tablet by mouth every 4 (four) hours as needed.    Marland Kitchen RIZATRIPTAN BENZOATE 10 MG PO TABS Oral Take 1 tablet (10 mg total) by mouth as needed for migraine. May repeat in 2 hours if needed 10 tablet 0    BP 106/63  Pulse 76  Temp 97.5 F (36.4 C) (Oral)  Resp 19  Ht 5' 2.5" (1.588 m)  Wt 198 lb (89.812 kg)  BMI 35.64 kg/m2  SpO2 100%  LMP 01/15/2012  Physical Exam  Nursing note and vitals reviewed. Constitutional: She is oriented to person, place, and time. She appears well-developed and well-nourished.  HENT:  Head: Normocephalic and atraumatic.  Right Ear: External ear normal.  Left Ear: External ear normal.  Nose:  Nose normal.  Mouth/Throat: Oropharynx is clear and moist.  Eyes: Conjunctivae and EOM are normal. Pupils are equal, round, and reactive to light.  Neck: Normal range of motion. Neck supple. No JVD present. No tracheal deviation present. No thyromegaly present.  Cardiovascular: Normal rate, regular rhythm, normal heart sounds and intact distal pulses.  Exam reveals no gallop and no friction rub.   No murmur heard. Pulmonary/Chest: Effort normal and breath sounds normal. No stridor. No respiratory distress. She has no wheezes. She has no rales. She exhibits no tenderness.  Abdominal: Soft. Bowel sounds are normal. She exhibits no distension and no mass. There is no tenderness. There is no rebound and no guarding.  Musculoskeletal: Normal range of motion. She exhibits tenderness  (patient with tenderness to posterior calf on right side without cords noted. She has normal sensation of the overlying skin, but reports subjective decrease sensation across her right foot.). She exhibits no edema.  Lymphadenopathy:    She has no cervical adenopathy.  Neurological: She is oriented to person, place, and time. She has normal reflexes. No cranial nerve deficit. She exhibits normal muscle tone. Coordination normal.  Skin: Skin is dry. No rash noted. No erythema. No pallor.  Psychiatric: She has a normal mood and affect. Her behavior is normal. Judgment and thought content normal.    ED Course  Procedures (including critical care time)   Labs Reviewed  POCT I-STAT, CHEM 8  PROTIME-INR  LAB REPORT - SCANNED   No results found.   1. Migraine   2. Pain of right lower leg       MDM  Patient with typical migraine presentation along with new right lower lip leg pain. Pain is concerning for possible DVT. Patient's headache is better after headache cocktail, she understands to return in the morning for venous Doppler of her lower leg. She is to followup with her primary care Dr. for further workup should the ultrasound be negative.        Olivia Mackie, MD 02/02/12 [phone]

## 2012-02-02 NOTE — ED Notes (Signed)
Medication given during downtime charting, please refer to downtime documentation.

## 2012-02-02 NOTE — ED Notes (Signed)
No rx given, pt voiced understanding to f/u with PCP and to return in morning for DVT study.  Given pink slip of paper to return with.  Signed d/c on downtime paperwork, no e-signature.

## 2012-02-18 ENCOUNTER — Encounter: Payer: 59 | Admitting: Gynecology

## 2012-03-01 ENCOUNTER — Ambulatory Visit (INDEPENDENT_AMBULATORY_CARE_PROVIDER_SITE_OTHER): Payer: 59 | Admitting: Gynecology

## 2012-03-01 ENCOUNTER — Encounter: Payer: Self-pay | Admitting: Gynecology

## 2012-03-01 VITALS — BP 126/80 | Ht 62.5 in | Wt 201.0 lb

## 2012-03-01 DIAGNOSIS — R635 Abnormal weight gain: Secondary | ICD-10-CM | POA: Insufficient documentation

## 2012-03-01 DIAGNOSIS — N920 Excessive and frequent menstruation with regular cycle: Secondary | ICD-10-CM

## 2012-03-01 DIAGNOSIS — D219 Benign neoplasm of connective and other soft tissue, unspecified: Secondary | ICD-10-CM

## 2012-03-01 DIAGNOSIS — Z01419 Encounter for gynecological examination (general) (routine) without abnormal findings: Secondary | ICD-10-CM

## 2012-03-01 DIAGNOSIS — N870 Mild cervical dysplasia: Secondary | ICD-10-CM

## 2012-03-01 DIAGNOSIS — E7800 Pure hypercholesterolemia, unspecified: Secondary | ICD-10-CM

## 2012-03-01 DIAGNOSIS — R319 Hematuria, unspecified: Secondary | ICD-10-CM

## 2012-03-01 DIAGNOSIS — D259 Leiomyoma of uterus, unspecified: Secondary | ICD-10-CM

## 2012-03-01 LAB — CBC WITH DIFFERENTIAL/PLATELET
Basophils Absolute: 0 10*3/uL (ref 0.0–0.1)
Basophils Relative: 0 % (ref 0–1)
Eosinophils Absolute: 0.1 10*3/uL (ref 0.0–0.7)
MCH: 30 pg (ref 26.0–34.0)
MCHC: 34 g/dL (ref 30.0–36.0)
Neutro Abs: 3.4 10*3/uL (ref 1.7–7.7)
Neutrophils Relative %: 56 % (ref 43–77)
Platelets: 279 10*3/uL (ref 150–400)
RDW: 12.5 % (ref 11.5–15.5)

## 2012-03-01 LAB — HEMOGLOBIN A1C
Hgb A1c MFr Bld: 5.2 % (ref <5.7)
Mean Plasma Glucose: 103 mg/dL (ref <117)

## 2012-03-01 LAB — CHOLESTEROL, TOTAL: Cholesterol: 207 mg/dL — ABNORMAL HIGH (ref 0–200)

## 2012-03-01 LAB — TSH: TSH: 0.97 u[IU]/mL (ref 0.350–4.500)

## 2012-03-01 NOTE — Patient Instructions (Addendum)
Fibroids You have been diagnosed as having a fibroid. Fibroids are smooth muscle lumps (tumors) which can occur any place in a woman's body. They are usually in the womb (uterus). The most common problem (symptom) of fibroids is bleeding. Over time this may cause low red blood cells (anemia). Other symptoms include feelings of pressure and pain in the pelvis. The diagnosis (learning what is wrong) of fibroids is made by physical exam. Sometimes tests such as an ultrasound are used. This is helpful when fibroids are felt around the ovaries and to look for tumors. TREATMENT   Most fibroids do not need surgical or medical treatment. Sometimes a tissue sample (biopsy) of the lining of the uterus is done to rule out cancer. If there is no cancer and only a small amount of bleeding, the problem can be watched.   Hormonal treatment can improve the problem.   When surgery is needed, it can consist of removing the fibroid. Vaginal birth may not be possible after the removal of fibroids. This depends on where they are and the extent of surgery. When pregnancy occurs with fibroids it is usually normal.   Your caregiver can help decide which treatments are best for you.  HOME CARE INSTRUCTIONS   Do not use aspirin as this may increase bleeding problems.   If your periods (menses) are heavy, record the number of pads or tampons used per month. Bring this information to your caregiver. This can help them determine the best treatment for you.  SEEK IMMEDIATE MEDICAL CARE IF:  You have pelvic pain or cramps not controlled with medications, or experience a sudden increase in pain.   You have an increase of pelvic bleeding between and during menses.   You feel lightheaded or have fainting spells.   You develop worsening belly (abdominal) pain.  Document Released: 08/06/2000 Document Revised: 07/29/2011 Document Reviewed: 03/28/2008 [facility] Patient Information 2012 [place], [place].  Hysterectomy  Information  A hysterectomy is a procedure where your uterus is surgically removed. It will no longer be possible to have menstrual periods or to become pregnant. The tubes and ovaries can be removed (bilateral salpingo-oopherectomy) during this surgery as well.  REASONS FOR A HYSTERECTOMY  Persistent, abnormal bleeding.   Lasting (chronic) pelvic pain or infection.   The lining of the uterus (endometrium) starts growing outside the uterus (endometriosis).   The endometrium starts growing in the muscle of the uterus (adenomyosis).   The uterus falls down into the vagina (pelvic organ prolapse).   Symptomatic uterine fibroids.   Precancerous cells.   Cervical cancer or uterine cancer.  TYPES OF HYSTERECTOMIES  Supracervical hysterectomy. This type removes the top part of the uterus, but not the cervix.   Total hysterectomy. This type removes the uterus and cervix.   Radical hysterectomy. This type removes the uterus, cervix, and the fibrous tissue that holds the uterus in place in the pelvis (parametrium).  WAYS A HYSTERECTOMY CAN BE PERFORMED  Abdominal hysterectomy. A large surgical cut (incision) is made in the abdomen. The uterus is removed through this incision.   Vaginal hysterectomy. An incision is made in the vagina. The uterus is removed through this incision. There are no abdominal incisions.   Conventional laparoscopic hysterectomy. A thin, lighted tube with a camera (laparoscope) is inserted into 3 or 4 small incisions in the abdomen. The uterus is cut into small pieces. The small pieces are removed through the incisions, or they are removed through the vagina.   Laparoscopic assisted vaginal  hysterectomy (LAVH). Three or four small incisions are made in the abdomen. Part of the surgery is performed laparoscopically and part vaginally. The uterus is removed through the vagina.   Robot-assisted laparoscopic hysterectomy. A laparoscope is inserted into 3 or 4 small  incisions in the abdomen. A computer-controlled device is used to give the surgeon a 3D image. This allows for more precise movements of surgical instruments. The uterus is cut into small pieces and removed through the incisions or removed through the vagina.  RISKS OF HYSTERECTOMY   Bleeding and risk of blood transfusion. Tell your caregiver if you do not want to receive any blood products.   Blood clots in the legs or lung.   Infection.   Injury to surrounding organs.   Anesthesia problems or side effects.   Conversion to an abdominal hysterectomy.  WHAT TO EXPECT AFTER A HYSTERECTOMY  You will be given pain medicine.   You will need to have someone with you for the first 3 to 5 days after you go home.   You will need to follow up with your surgeon in 2 to 4 weeks after surgery to evaluate your progress.   You may have early menopause symptoms like hot flashes, night sweats, and insomnia.   If you had a hysterectomy for a problem that was not a cancer or a condition that could lead to cancer, then you no longer need Pap tests. However, even if you no longer need a Pap test, a regular exam is a good idea to make sure no other problems are starting.  Document Released: 02/02/2001 Document Revised: 07/29/2011 Document Reviewed: 03/20/2011 [facility] Patient Information 2012 [place], [place].  Exercise to Lose Weight Exercise and a healthy diet may help you lose weight. Your doctor may suggest specific exercises. EXERCISE IDEAS AND TIPS  Choose low-cost things you enjoy doing, such as walking, bicycling, or exercising to workout videos.   Take stairs instead of the elevator.   Walk during your lunch break.   Park your car further away from work or school.   Go to a gym or an exercise class.   Start with 5 to 10 minutes of exercise each day. Build up to 30 minutes of exercise 4 to 6 days a week.   Wear shoes with good support and comfortable clothes.   Stretch before and after  working out.   Work out until you breathe harder and your heart beats faster.   Drink extra water when you exercise.   Do not do so much that you hurt yourself, feel dizzy, or get very short of breath.  Exercises that burn about 150 calories:  Running 1  miles in 15 minutes.   Playing volleyball for 45 to 60 minutes.   Washing and waxing a car for 45 to 60 minutes.   Playing touch football for 45 minutes.   Walking 1  miles in 35 minutes.   Pushing a stroller 1  miles in 30 minutes.   Playing basketball for 30 minutes.   Raking leaves for 30 minutes.   Bicycling 5 miles in 30 minutes.   Walking 2 miles in 30 minutes.   Dancing for 30 minutes.   Shoveling snow for 15 minutes.   Swimming laps for 20 minutes.   Walking up stairs for 15 minutes.   Bicycling 4 miles in 15 minutes.   Gardening for 30 to 45 minutes.   Jumping rope for 15 minutes.   Washing windows or floors for 45  to 60 minutes.  Document Released: 09/11/2010 Document Revised: 04/21/2011 Document Reviewed: 09/11/2010 [facility] Patient Information 2012 [place], [place].                                                  Cholesterol Control Diet  Cholesterol levels in your body are determined significantly by your diet. Cholesterol levels may also be related to heart disease. The following material helps to explain this relationship and discusses what you can do to help keep your heart healthy. Not all cholesterol is bad. Low-density lipoprotein (LDL) cholesterol is the "bad" cholesterol. It may cause fatty deposits to build up inside your arteries. High-density lipoprotein (HDL) cholesterol is "good." It helps to remove the "bad" LDL cholesterol from your blood. Cholesterol is a very important risk factor for heart disease. Other risk factors are high blood pressure, smoking, stress, heredity, and weight. The heart muscle gets its supply of blood through the coronary arteries. If your LDL cholesterol is high  and your HDL cholesterol is low, you are at risk for having fatty deposits build up in your coronary arteries. This leaves less room through which blood can flow. Without sufficient blood and oxygen, the heart muscle cannot function properly and you may feel chest pains (angina pectoris). When a coronary artery closes up entirely, a part of the heart muscle may die, causing a heart attack (myocardial infarction). CHECKING CHOLESTEROL When your caregiver sends your blood to a lab to be analyzed for cholesterol, a complete lipid (fat) profile may be done. With this test, the total amount of cholesterol and levels of LDL and HDL are determined. Triglycerides are a type of fat that circulates in the blood and can also be used to determine heart disease risk. The list below describes what the numbers should be: Test: Total Cholesterol.  Less than 200 mg/dl.  Test: LDL "bad cholesterol."  Less than 100 mg/dl.   Less than 70 mg/dl if you are at very high risk of a heart attack or sudden cardiac death.  Test: HDL "good cholesterol."  Greater than 50 mg/dl for women.   Greater than 40 mg/dl for men.  Test: Triglycerides.  Less than 150 mg/dl.  CONTROLLING CHOLESTEROL WITH DIET Although exercise and lifestyle factors are important, your diet is key. That is because certain foods are known to raise cholesterol and others to lower it. The goal is to balance foods for their effect on cholesterol and more importantly, to replace saturated and trans fat with other types of fat, such as monounsaturated fat, polyunsaturated fat, and omega-3 fatty acids. On average, a person should consume no more than 15 to 17 g of saturated fat daily. Saturated and trans fats are considered "bad" fats, and they will raise LDL cholesterol. Saturated fats are primarily found in animal products such as meats, butter, and cream. However, that does not mean you need to sacrifice all your favorite foods. Today, there are good  tasting, low-fat, low-cholesterol substitutes for most of the things you like to eat. Choose low-fat or nonfat alternatives. Choose round or loin cuts of red meat, since these types of cuts are lowest in fat and cholesterol. Chicken (without the skin), fish, veal, and ground [place] breast are excellent choices. Eliminate fatty meats, such as hot dogs and salami. Even shellfish have little or no saturated fat. Have a 3 oz (  85 g) portion when you eat lean meat, poultry, or fish. Trans fats are also called "partially hydrogenated oils." They are oils that have been scientifically manipulated so that they are solid at room temperature resulting in a longer shelf life and improved taste and texture of foods in which they are added. Trans fats are found in stick margarine, some tub margarines, cookies, crackers, and baked goods.  When baking and cooking, oils are an excellent substitute for butter. The monounsaturated oils are especially beneficial since it is believed they lower LDL and raise HDL. The oils you should avoid entirely are saturated tropical oils, such as coconut and palm.  Remember to eat liberally from food groups that are naturally free of saturated and trans fat, including fish, fruit, vegetables, beans, grains (barley, rice, couscous, bulgur wheat), and pasta (without cream sauces).  IDENTIFYING FOODS THAT LOWER CHOLESTEROL  Soluble fiber may lower your cholesterol. This type of fiber is found in fruits such as apples, vegetables such as broccoli, potatoes, and carrots, legumes such as beans, peas, and lentils, and grains such as barley. Foods fortified with plant sterols (phytosterol) may also lower cholesterol. You should eat at least 2 g per day of these foods for a cholesterol lowering effect.  Read package labels to identify low-saturated fats, trans fats free, and low-fat foods at the supermarket. Select cheeses that have only 2 to 3 g saturated fat per ounce. Use a heart-healthy tub  margarine that is free of trans fats or partially hydrogenated oil. When buying baked goods (cookies, crackers), avoid partially hydrogenated oils. Breads and muffins should be made from whole grains (whole-wheat or whole oat flour, instead of "flour" or "enriched flour"). Buy non-creamy canned soups with reduced salt and no added fats.  FOOD PREPARATION TECHNIQUES  Never deep-fry. If you must fry, either stir-fry, which uses very little fat, or use non-stick cooking sprays. When possible, broil, bake, or roast meats, and steam vegetables. Instead of dressing vegetables with butter or margarine, use lemon and herbs, applesauce and cinnamon (for squash and sweet potatoes), nonfat yogurt, salsa, and low-fat dressings for salads.  LOW-SATURATED FAT / LOW-FAT FOOD SUBSTITUTES Meats / Saturated Fat (g)  Avoid: Steak, marbled (3 oz/85 g) / 11 g   Choose: Steak, lean (3 oz/85 g) / 4 g   Avoid: Hamburger (3 oz/85 g) / 7 g   Choose: Hamburger, lean (3 oz/85 g) / 5 g   Avoid: Ham (3 oz/85 g) / 6 g   Choose: Ham, lean cut (3 oz/85 g) / 2.4 g   Avoid: Chicken, with skin, dark meat (3 oz/85 g) / 4 g   Choose: Chicken, skin removed, dark meat (3 oz/85 g) / 2 g   Avoid: Chicken, with skin, light meat (3 oz/85 g) / 2.5 g   Choose: Chicken, skin removed, light meat (3 oz/85 g) / 1 g  Dairy / Saturated Fat (g)  Avoid: Whole milk (1 cup) / 5 g   Choose: Low-fat milk, 2% (1 cup) / 3 g   Choose: Low-fat milk, 1% (1 cup) / 1.5 g   Choose: Skim milk (1 cup) / 0.3 g   Avoid: Hard cheese (1 oz/28 g) / 6 g   Choose: Skim milk cheese (1 oz/28 g) / 2 to 3 g   Avoid: Cottage cheese, 4% fat (1 cup) / 6.5 g   Choose: Low-fat cottage cheese, 1% fat (1 cup) / 1.5 g   Avoid: Ice cream (1 cup) / 9  g   Choose: Sherbet (1 cup) / 2.5 g   Choose: Nonfat frozen yogurt (1 cup) / 0.3 g   Choose: Frozen fruit bar / trace   Avoid: Whipped cream (1 tbs) / 3.5 g   Choose: Nondairy whipped topping (1 tbs) / 1  g  Condiments / Saturated Fat (g)  Avoid: Mayonnaise (1 tbs) / 2 g   Choose: Low-fat mayonnaise (1 tbs) / 1 g   Avoid: Butter (1 tbs) / 7 g   Choose: Extra light margarine (1 tbs) / 1 g   Avoid: Coconut oil (1 tbs) / 11.8 g   Choose: Olive oil (1 tbs) / 1.8 g   Choose: Corn oil (1 tbs) / 1.7 g   Choose: Safflower oil (1 tbs) / 1.2 g   Choose: Sunflower oil (1 tbs) / 1.4 g   Choose: Soybean oil (1 tbs) / 2.4 g   Choose: Canola oil (1 tbs) / 1 g  Document Released: 08/09/2005 Document Revised: 04/21/2011 Document Reviewed: 01/28/2011 [facility] Patient Information 2012 [place], [place].

## 2012-03-01 NOTE — Progress Notes (Signed)
Theresa Miranda 06/16/1971 [id number]   History:    41 y.o.  for annual gyn exam with the only complaining of feeling bloated at times. Patient last year had an endometrial ablation her option technique. Patient states that she has had still episodes of menorrhagia.  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 Patient does her monthly self breast examination. Patient has continued to gain weight she was weighing 198 is now up to 201 (BMI 36.18). Patient with prior tubal sterilization procedure.  Past medical history,surgical history, family history and social history were all reviewed and documented in the EPIC chart.  Gynecologic History Patient's last menstrual period was 02/20/2012. Contraception: tubal ligation Last Pap: January 2013. Results were: Low-grade squamous intraepithelial lesion with HPV Last mammogram: No prior study. Results were: No prior study  Obstetric History OB History    Grav Para Term Preterm Abortions TAB SAB Ect Mult Living   0                ROS: A ROS was performed and pertinent positives and negatives are included in the history.  GENERAL: No fevers or chills. HEENT: No change in vision, no earache, sore throat or sinus congestion. NECK: No pain or stiffness. CARDIOVASCULAR: No chest pain or pressure. No palpitations. PULMONARY: No shortness of breath, cough or wheeze. GASTROINTESTINAL: No abdominal pain, nausea, vomiting or diarrhea, melena or bright red blood per rectum. GENITOURINARY: No urinary frequency, urgency, hesitancy or dysuria. MUSCULOSKELETAL: No joint or muscle pain, no back pain, no recent trauma. DERMATOLOGIC: No rash, no itching, no lesions. ENDOCRINE: No polyuria, polydipsia, no heat or cold intolerance. No recent change in weight. HEMATOLOGICAL: No anemia or easy bruising  or bleeding. NEUROLOGIC: No headache, seizures, numbness, tingling or weakness. PSYCHIATRIC: No depression, no loss of interest in normal activity or change in sleep pattern.     Exam: chaperone present  BP 126/80  Ht 5' 2.5" (1.588 m)  Wt 201 lb (91.173 kg)  BMI 36.18 kg/m2  LMP 02/20/2012  Body mass index is 36.18 kg/(m^2).  General appearance : Well developed well nourished female. No acute distress HEENT: Neck supple, trachea midline, no carotid bruits, no thyroidmegaly Lungs: Clear to auscultation, no rhonchi or wheezes, or rib retractions  Heart: Regular rate and rhythm, no murmurs or gallops Breast:Examined in sitting and supine position were symmetrical in appearance, no palpable masses or tenderness,  no skin retraction, no nipple inversion, no nipple discharge, no skin discoloration, no axillary or supraclavicular lymphadenopathy Abdomen: no palpable masses or tenderness, no rebound or guarding Extremities: no edema or skin discoloration or tenderness  Pelvic:  Bartholin, Urethra, Skene Glands: Within normal limits             Vagina: No gross lesions or discharge  Cervix: No gross lesions or discharge  Uterus  10 week size irregular, normal size, shape and consistency, non-tender and mobile  Adnexa  Without masses or tenderness  Anus and perineum  normal   Rectovaginal  normal sphincter tone without palpated masses or tenderness             Hemoccult not done     Assessment/Plan:  41 y.o. 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 Followup Pap smear was done today. New Pap smear screening guidelines and followup have been discussed.  Pap smear done today. Patient has had history of menorrhagia despite having had endometrial ablation. Review of patient's ultrasound in July of 2012 had demonstrated that she has 9 intramural and subserosal myomas the largest one measuring 24 x 19 mm. The reason for her symptoms may be  attributed to these fibroids and we had discussed that if she continues with her menorrhagia and bloating sensation then we may want to consider with a total laparoscopic hysterectomy with ovarian conservation. This would address both issues the persistent menorrhagia as a result of her fibroid uterus as well as her persistent dysplasia. Literature information was provided. And she will return back next month for followup ultrasound and final determination. The following labs were drawn today: TSH, hemoglobin A1c, total cholesterol, CBC, and urinalysis.    Ok Edwards MD, 5:16 PM 03/01/2012

## 2012-03-02 LAB — URINALYSIS W MICROSCOPIC + REFLEX CULTURE
Bacteria, UA: NONE SEEN
Bilirubin Urine: NEGATIVE
Glucose, UA: NEGATIVE mg/dL
Ketones, ur: NEGATIVE mg/dL
Leukocytes, UA: NEGATIVE
Protein, ur: NEGATIVE mg/dL

## 2012-03-02 NOTE — Addendum Note (Signed)
Addended by: Venora Maples on: 03/02/2012 10:37 AM   Modules accepted: Orders

## 2012-03-08 ENCOUNTER — Telehealth: Payer: Self-pay | Admitting: *Deleted

## 2012-03-08 NOTE — Telephone Encounter (Signed)
Patient called for pap results. Told patient that there were no results in yet.  Told patient to please call back in a couple days to check back with [place].

## 2012-04-14 ENCOUNTER — Other Ambulatory Visit: Payer: 59

## 2012-04-14 ENCOUNTER — Ambulatory Visit: Payer: 59 | Admitting: Gynecology

## 2012-07-04 ENCOUNTER — Ambulatory Visit (INDEPENDENT_AMBULATORY_CARE_PROVIDER_SITE_OTHER): Payer: 59 | Admitting: Family Medicine

## 2012-07-04 VITALS — BP 114/77 | HR 66 | Temp 98.4°F | Resp 16 | Ht 63.0 in | Wt 198.0 lb

## 2012-07-04 DIAGNOSIS — R110 Nausea: Secondary | ICD-10-CM

## 2012-07-04 DIAGNOSIS — R21 Rash and other nonspecific skin eruption: Secondary | ICD-10-CM

## 2012-07-04 DIAGNOSIS — R51 Headache: Secondary | ICD-10-CM

## 2012-07-04 MED ORDER — CLOBETASOL PROPIONATE 0.05 % EX CREA: TOPICAL_CREAM | Freq: Two times a day (BID) | CUTANEOUS | Status: DC

## 2012-07-04 MED ORDER — HYDROXYZINE HCL 25 MG PO TABS: 25.0000 mg | ORAL_TABLET | Freq: Three times a day (TID) | ORAL | Status: DC | PRN

## 2012-07-04 MED ORDER — KETOROLAC TROMETHAMINE 60 MG/2ML IM SOLN
60.0000 mg | Freq: Once | INTRAMUSCULAR | Status: AC
  Administered 2012-07-04: 60 mg via INTRAMUSCULAR

## 2012-07-04 MED ORDER — RIZATRIPTAN BENZOATE 10 MG PO TABS: 10.0000 mg | ORAL_TABLET | ORAL | Status: DC | PRN

## 2012-07-04 MED ORDER — PROMETHAZINE HCL 25 MG PO TABS: 25.0000 mg | ORAL_TABLET | Freq: Three times a day (TID) | ORAL | Status: DC | PRN

## 2012-07-04 NOTE — Progress Notes (Signed)
[facility]:  Office Visit  Chief Complaint:  Chief Complaint  Patient presents with  . Migraine    began yesterday  . Insect Bite    noticed about 1 week ago- legs, arms, back- itchy  . Medication Refill    Maxalt    HPI: Theresa Miranda is a 41 y.o. female who complains of  1.5 weeks h/o rash, itching, constant, worse at night, tried benadryl and otc hydrocrotisone without relief. Was in [place] at extensive stay about the same time. She first noticed a linear rash on her right breast and then that resolved and she got other areas that were itchy from her back to her arms and legs. She thought they were hives but they itch constantly and worse at night. She does not have any on her staomach. The area on the back is high on her neck where her skin is exposed. Migraine HA in back of head, light sensitivity, nausea;  no other sysmptoms. MIgraine since yesterday. History of migraine HAs, ran out of Maxalt  Past Medical History  Diagnosis Date  . Depression   . Migraines   . Migraine   . Allergy   . Anemia   . Anxiety    Past Surgical History  Procedure Date  . Tonsillectomy and adenoidectomy 1979  . Carpal tunnel release 1999  . Cervical cerclage 1991, 1996  . Tubal ligation   . Uterine ablation   . Appendectomy    History   Social History  . Marital Status: Widowed    Spouse Name: N/A    Number of Children: N/A  . Years of Education: N/A   Occupational History  . A/R Supervisor [employer]   Social History Main Topics  . Smoking status: Never Smoker   . Smokeless tobacco: Never Used  . Alcohol Use: Yes     Comment: OCC  . Drug Use: Yes  . Sexually Active: Yes    Birth Control/ Protection: Surgical     Comment: TUBAL LIGATION   Other Topics Concern  . None   Social History Narrative  . None   Family History  Problem Relation Age of Onset  . Cancer Maternal Grandmother     lung  . Cancer Maternal Grandfather     prostate  . Alcohol  abuse Mother   . Cancer Paternal Grandmother     lung  . Stroke Paternal Grandfather    Allergies  Allergen Reactions  . Clindamycin/Lincomycin Anaphylaxis    Pt has "anaphylaxis" to Clindamycin per [facility] pharmacist.  . Zithromax (Azithromycin)    Prior to Admission medications   Medication Sig Start Date End Date Taking? Authorizing Provider  Aspirin-Acetaminophen-Caffeine (EXCEDRIN PO) Take 2 tablets by mouth every 6 (six) hours as needed. For headaches   Yes Historical Provider, MD  Aspirin-Salicylamide-Caffeine (BC HEADACHE POWDER PO) Take 1 packet by mouth daily as needed. For headaches   Yes Historical Provider, MD  ondansetron (ZOFRAN) 4 MG tablet Take 4 mg by mouth every 8 (eight) hours as needed. For nausea   Yes Historical Provider, MD  rizatriptan (MAXALT) 10 MG tablet Take 1 tablet (10 mg total) by mouth as needed for migraine. May repeat in 2 hours if needed 11/22/11 11/21/12 Yes Remi Haggard, NP  HYDROcodone-acetaminophen (VICODIN) 5-500 MG per tablet Take 1 tablet by mouth every 6 (six) hours as needed. For pain    Historical Provider, MD  pseudoephedrine-acetaminophen (TYLENOL SINUS) 30-500 MG TABS Take 1 tablet by mouth  every 4 (four) hours as needed.    Historical Provider, MD     ROS: The patient denies fevers, chills, night sweats, unintentional weight loss, chest pain, palpitations, wheezing, dyspnea on exertion,  vomiting, abdominal pain, dysuria, hematuria, melena, numbness, weakness, or tingling. + nausea  All other systems have been reviewed and were otherwise negative with the exception of those mentioned in the HPI and as above.    PHYSICAL EXAM: Filed Vitals:   07/04/12 1802  BP: 114/77  Pulse: 66  Temp: 98.4 F (36.9 C)  Resp: 16   Filed Vitals:   07/04/12 1802  Height: 5\' 3"  (1.6 m)  Weight: 198 lb (89.812 kg)   Body mass index is 35.07 kg/(m^2).  General: Alert, no acute distress HEENT:  Normocephalic, atraumatic, oropharynx patent.  EOMI, PERRLA, fundoscopic exam nl. Minimal photophobia.  Cardiovascular:  Regular rate and rhythm, no rubs murmurs or gallops.  No Carotid bruits, radial pulse intact. No pedal edema.  Respiratory: Clear to auscultation bilaterally.  No wheezes, rales, or rhonchi.  No cyanosis, no use of accessory musculature GI: No organomegaly, abdomen is soft and non-tender, positive bowel sounds.  No masses. Skin: + red, papules in 1-2 linear formation with whelps on neck, arms, legs. Size varies from 5 mm -10 mm. No erythema, + tenderness on palpation Neurologic: Facial musculature symmetric. Psychiatric: Patient is appropriate throughout our interaction. Lymphatic: No cervical lymphadenopathy Musculoskeletal: Gait intact.   LABS: Results for orders placed in visit on 03/01/12  URINALYSIS WITH CULTURE REFLEX      Component Value Range   Color, Urine YELLOW  YELLOW   APPearance CLEAR  CLEAR   Specific Gravity, Urine 1.024  1.005 - 1.030   pH 5.5  5.0 - 8.0   Glucose, UA NEG  NEG mg/dL   Bilirubin Urine NEG  NEG   Ketones, ur NEG  NEG mg/dL   Hgb urine dipstick SMALL (*) NEG   Protein, ur NEG  NEG mg/dL   Urobilinogen, UA 0.2  0.0 - 1.0 mg/dL   Nitrite NEG  NEG   Leukocytes, UA NEG  NEG   Squamous Epithelial / LPF RARE  RARE   Crystals NONE SEEN  NONE SEEN   Casts NONE SEEN  NONE SEEN   WBC, UA 0-2  <3 WBC/hpf   RBC / HPF 0-2  <3 RBC/hpf   Bacteria, UA NONE SEEN  RARE  HEMOGLOBIN A1C      Component Value Range   Hemoglobin A1C 5.2  <5.7 %   Mean Plasma Glucose 103  <117 mg/dL  CBC WITH DIFFERENTIAL      Component Value Range   WBC 6.1  4.0 - 10.5 K/uL   RBC 4.34  3.87 - 5.11 MIL/uL   Hemoglobin 13.0  12.0 - 15.0 g/dL   HCT 16.1  09.6 - 04.5 %   MCV 88.0  78.0 - 100.0 fL   MCH 30.0  26.0 - 34.0 pg   MCHC 34.0  30.0 - 36.0 g/dL   RDW 40.9  81.1 - 91.4 %   Platelets 279  150 - 400 K/uL   Neutrophils Relative 56  43 - 77 %   Neutro Abs 3.4  1.7 - 7.7 K/uL   Lymphocytes Relative 35  12  - 46 %   Lymphs Abs 2.2  0.7 - 4.0 K/uL   Monocytes Relative 7  3 - 12 %   Monocytes Absolute 0.4  0.1 - 1.0 K/uL   Eosinophils Relative 2  0 - 5 %   Eosinophils Absolute 0.1  0.0 - 0.7 K/uL   Basophils Relative 0  0 - 1 %   Basophils Absolute 0.0  0.0 - 0.1 K/uL   Smear Review Criteria for review not met    TSH      Component Value Range   TSH 0.970  0.350 - 4.500 uIU/mL  CHOLESTEROL, TOTAL      Component Value Range   Cholesterol 207 (*) 0 - 200 mg/dL     EKG/XRAY:   Primary read interpreted by Dr. Conley Rolls at [facility]   ASSESSMENT/PLAN: Encounter Diagnoses  Name Primary?  . Rash Yes  . History of migraine headaches    Toradol injection, Refill Maxalt, Rx Phenergan for nausea and HA ? Rash from bed bugs, does not look like hives, or scabies. She does have a hx of recent travel and hotel stay that fits the picture of bedbugs. I gave her and duaghter who has similar sxs precautions and treatment advice for bedbugs.  F/u prn   Elfida Shimada PHUONG, DO 07/04/2012 6:42 PM

## 2012-07-04 NOTE — Patient Instructions (Addendum)
Bedbugs  Bedbugs are tiny bugs that live in and around beds. During the day, they hide in mattresses and other places near beds. They come out at night and bite people lying in bed. They need blood to live and grow. Bedbugs can be found in beds anywhere. Usually, they are found in places where many people come and go (hotels, shelters, hospitals). It does not matter whether the place is dirty or clean.  Getting bitten by bedbugs rarely causes a medical problem. The biggest problem can be getting rid of them. This often takes the work of a pest control expert.  CAUSES   Less use of pesticides. Bedbugs were common before the 1950s. Then, strong pesticides such as DDT nearly wiped them out. Today, these pesticides are not used because they harm the environment and can cause health problems.   More travel. Besides mattresses, bedbugs can also live in clothing and luggage. They can come along as people travel from place to place. Bedbugs are more common in certain parts of the world. When people travel to those areas, the bugs can come home with them.   Presence of birds and bats. Bedbugs often infest birds and bats. If you have these animals in or near your home, bedbugs may infest your house, too.  SYMPTOMS  It does not hurt to be bitten by a bedbug. You will probably not wake up when you are bitten. Bedbugs usually bite areas of the skin that are not covered. Symptoms may show when you wake up, or they may take a day or more to show up. Symptoms may include:   Small red bumps on the skin. These might be lined up in a row or clustered in a group.   A darker red dot in the middle of red bumps.   Blisters on the skin. There may be swelling and very bad itching. These may be signs of an allergic reaction. This does not happen often.  DIAGNOSIS   Bedbug bites might look and feel like other types of insect bites. The bugs do not stay on the body like ticks or lice. They bite, drop off, and crawl away to hide. Your caregiver will probably:   Ask about your symptoms.   Ask about your recent activities and travel.   Check your skin for bedbug bites.   Ask you to check at home for signs of bedbugs. You should look for:   Spots or stains on the bed or nearby. This could be from bedbugs that were crushed or from their eggs or waste.   Bedbugs themselves. They are reddish-brown, oval, and flat. They do not fly. They are about the size of an apple seed.   Places to look for bedbugs include:   Beds. Check mattresses, headboards, box springs, and bed frames.   On drapes and curtains near the bed.   Under carpeting in the bedroom.   Behind electrical outlets.   Behind any wallpaper that is peeling.   Inside luggage.  TREATMENT  Most bedbug bites do not need treatment. They usually go away on their own in a few days. The bites are not dangerous. However, treatment may be needed if you have scratched so much that your skin has become infected. You may also need treatment if you are allergic to bedbug bites. Treatment options include:   A drug that stops swelling and itching (corticosteroid). Usually, a cream is rubbed on the skin. If you have a bad rash, you may be   are pills to help control itching.   Antibiotic medicines. An antibiotic may be prescribed for infected skin.  HOME CARE INSTRUCTIONS    Take any medicine prescribed by your caregiver for your bites. Follow the directions carefully.   Consider wearing pajamas with long sleeves and pant legs.   Your bedroom may need to be treated. A pest control expert should make sure the bedbugs are gone. You may need to throw away mattresses or luggage. Ask the pest control expert what you can do to keep the bedbugs from coming back. Common suggestions include:   Putting a plastic cover over your mattress.   Washing and drying your clothes and bedding in hot water and a hot dryer. The  temperature should be hotter than 120 F (48.9 C). Bedbugs are killed by high temperatures.   Vacuuming carefully all around your bed. Vacuum in all cracks and crevices where the bugs might hide. Do this often.   Carefully checking all used furniture, bedding, or clothes that you bring into your house.   Eliminating bird nests and bat roosts.   If you get bedbug bites when traveling, check all your possessions carefully before bringing them into your house. If you find any bugs on clothes or in your luggage, consider throwing those items away.  SEEK MEDICAL CARE IF:  You have red bug bites that keep coming back.   You have red bug bites that itch badly.   You have bug bites that cause a skin rash.   You have scratch marks that are red and sore.  SEEK IMMEDIATE MEDICAL CARE IF: You have a fever. Document Released: 09/11/2010 Document Revised: 11/01/2011 Document Reviewed: 09/11/2010 [facility] Patient Information 2013 [place], [place].   Bedbugs Bedbugs are tiny bugs that live in and around beds. During the day, they hide in mattresses and other places near beds. They come out at night and bite people lying in bed. They need blood to live and grow. Bedbugs can be found in beds anywhere. Usually, they are found in places where many people come and go (hotels, shelters, hospitals). It does not matter whether the place is dirty or clean. Getting bitten by bedbugs rarely causes a medical problem. The biggest problem can be getting rid of them.  This often takes the work of a Oncologist. CAUSES  Less use of pesticides. Bedbugs were common before the 1950s. Then, strong pesticides such as DDT nearly wiped them out. Today, these pesticides are not used because they harm the environment and can cause health problems.   More travel. Besides mattresses, bedbugs can also live in clothing and luggage. They can come along as people travel from place to place. Bedbugs are more common in certain  parts of the world. When people travel to those areas, the bugs can come home with them.   Presence of birds and bats. Bedbugs often infest birds and bats. If you have these animals in or near your home, bedbugs may infest your house, too.  SYMPTOMS It does not hurt to be bitten by a bedbug. You will probably not wake up when you are bitten. Bedbugs usually bite areas of the skin that are not covered. Symptoms may show when you wake up, or they may take a day or more to show up. Symptoms may include:  Small red bumps on the skin. These might be lined up in a row or clustered in a group.   A darker red dot in the middle of red  bumps.   Blisters on the skin. There may be swelling and very bad itching. These may be signs of an allergic reaction. This does not happen often.  DIAGNOSIS Bedbug bites might look and feel like other types of insect bites. The bugs do not stay on the body like ticks or lice. They bite, drop off, and crawl away to hide. Your caregiver will probably:  Ask about your symptoms.   Ask about your recent activities and travel.   Check your skin for bedbug bites.   Ask you to check at home for signs of bedbugs. You should look for:   Spots or stains on the bed or nearby. This could be from bedbugs that were crushed or from their eggs or waste.   Bedbugs themselves. They are reddish-brown, oval, and flat. They do not fly. They are about the size of an apple seed.   Places to look for bedbugs include:   Beds. Check mattresses, headboards, box springs, and bed frames.   On drapes and curtains near the bed.   Under carpeting in the bedroom.   Behind electrical outlets.   Behind any wallpaper that is peeling.   Inside luggage.  TREATMENT Most bedbug bites do not need treatment. They usually go away on their own in a few days. The bites are not dangerous. However, treatment may be needed if you have scratched so much that your skin has become infected. You may also  need treatment if you are allergic to bedbug bites. Treatment options include:  A drug that stops swelling and itching (corticosteroid). Usually, a cream is rubbed on the skin. If you have a bad rash, you may be given a corticosteroid pill.   Oral antihistamines. These are pills to help control itching.   Antibiotic medicines. An antibiotic may be prescribed for infected skin.  HOME CARE INSTRUCTIONS    Take any medicine prescribed by your caregiver for your bites. Follow the directions carefully.   Consider wearing pajamas with long sleeves and pant legs.   Your bedroom may need to be treated. A pest control expert should make sure the bedbugs are gone. You may need to throw away mattresses or luggage. Ask the pest control expert what you can do to keep the bedbugs from coming back. Common suggestions include:   Putting a plastic cover over your mattress.   Washing and drying your clothes and bedding in hot water and a hot dryer. The temperature should be hotter than 120 F (48.9 C). Bedbugs are killed by high temperatures.   Vacuuming carefully all around your bed. Vacuum in all cracks and crevices where the bugs might hide. Do this often.   Carefully checking all used furniture, bedding, or clothes that you bring into your house.   Eliminating bird nests and bat roosts.   If you get bedbug bites when traveling, check all your possessions carefully before bringing them into your house. If you find any bugs on clothes or in your luggage, consider throwing those items away.  SEEK MEDICAL CARE IF:  You have red bug bites that keep coming back.   You have red bug bites that itch badly.   You have bug bites that cause a skin rash.   You have scratch marks that are red and sore.  SEEK IMMEDIATE MEDICAL CARE IF: You have a fever. Document Released: 09/11/2010 Document Revised: 11/01/2011 Document Reviewed: 09/11/2010 [facility] Patient Information 2013 [place], [place].

## 2012-07-05 ENCOUNTER — Encounter: Payer: Self-pay | Admitting: Family Medicine

## 2012-09-21 ENCOUNTER — Encounter (HOSPITAL_COMMUNITY): Payer: Self-pay | Admitting: Emergency Medicine

## 2012-09-21 ENCOUNTER — Emergency Department (HOSPITAL_COMMUNITY)
Admission: EM | Admit: 2012-09-21 | Discharge: 2012-09-21 | Disposition: A | Discharge status: Home / Self Care | Payer: 59 | Source: Home / Self Care | Attending: Emergency Medicine | Admitting: Emergency Medicine

## 2012-09-21 DIAGNOSIS — G43909 Migraine, unspecified, not intractable, without status migrainosus: Secondary | ICD-10-CM

## 2012-09-21 DIAGNOSIS — J019 Acute sinusitis, unspecified: Secondary | ICD-10-CM

## 2012-09-21 MED ORDER — HYDROCODONE-ACETAMINOPHEN 5-500 MG PO TABS: 1.0000 | ORAL_TABLET | Freq: Four times a day (QID) | ORAL | Status: DC | PRN

## 2012-09-21 MED ORDER — FLUTICASONE PROPIONATE 50 MCG/ACT NA SUSP: 2.0000 | Freq: Every day | NASAL | Status: DC

## 2012-09-21 MED ORDER — RIZATRIPTAN BENZOATE 10 MG PO TABS: 10.0000 mg | ORAL_TABLET | ORAL | Status: DC | PRN

## 2012-09-21 MED ORDER — ONDANSETRON HCL 4 MG PO TABS: 4.0000 mg | ORAL_TABLET | Freq: Three times a day (TID) | ORAL | Status: DC | PRN

## 2012-09-21 MED ORDER — SALINE NASAL SPRAY 0.65 % NA SOLN: 1.0000 | NASAL | Status: DC | PRN

## 2012-09-21 MED ORDER — KETOROLAC TROMETHAMINE 60 MG/2ML IM SOLN
60.0000 mg | Freq: Once | INTRAMUSCULAR | Status: AC
  Administered 2012-09-21: 60 mg via INTRAMUSCULAR

## 2012-09-21 MED ORDER — KETOROLAC TROMETHAMINE 60 MG/2ML IM SOLN
INTRAMUSCULAR | Status: AC
  Filled 2012-09-21: qty 2

## 2012-09-21 MED ORDER — ONDANSETRON 4 MG PO TBDP
ORAL_TABLET | ORAL | Status: AC
  Filled 2012-09-21: qty 1

## 2012-09-21 MED ORDER — HYDROCODONE-ACETAMINOPHEN 5-325 MG PO TABS
1.0000 | ORAL_TABLET | Freq: Once | ORAL | Status: AC
  Administered 2012-09-21: 1 via ORAL

## 2012-09-21 MED ORDER — HYDROCODONE-ACETAMINOPHEN 5-325 MG PO TABS
ORAL_TABLET | ORAL | Status: AC
  Filled 2012-09-21: qty 1

## 2012-09-21 MED ORDER — ONDANSETRON 4 MG PO TBDP
8.0000 mg | ORAL_TABLET | Freq: Once | ORAL | Status: AC
  Administered 2012-09-21: 8 mg via ORAL

## 2012-09-21 NOTE — ED Provider Notes (Signed)
History     CSN: [id number]  Arrival date & time 09/21/12  1433   First MD Initiated Contact with Patient 09/21/12 1439      Chief Complaint  Patient presents with  . Migraine    (Consider location/radiation/quality/duration/timing/severity/associated sxs/prior treatment) Patient is a 42 y.o. female presenting with migraines. The history is provided by the patient.  Migraine This is a recurrent problem. Associated symptoms include headaches.  Patient complains of a 1 day history of persistent headache. Character:  Throbbing Location: frontal extending to right side Aggravating activities:  Walking, turning head Alleviating activities:  Supine, quiet,  No history of injury.  Not worst headache of life.  Does not awaken from sleep.  No radiation, dizziness, syncope, LOC, diplopia, loss of vision, aura; + photophobia. + nausea, no vomiting.  No extremity weakness, numbness, or paresthesias No relieved with home Maxalt, vicodin and zofran; pain 9/10 Last neurologist visit one month ago, will begin preventative medication tomorrow per neurologist. Past Medical History  Diagnosis Date  . Depression   . Migraines   . Allergy   . Anemia   . Anxiety     Past Surgical History  Procedure Date  . Tonsillectomy and adenoidectomy 1979  . Carpal tunnel release 1999  . Cervical cerclage 1991, 1996  . Tubal ligation   . Uterine ablation   . Appendectomy     Family History  Problem Relation Age of Onset  . Cancer Maternal Grandmother     lung  . Cancer Maternal Grandfather     prostate  . Alcohol abuse Mother   . Cancer Paternal Grandmother     lung  . Stroke Paternal Grandfather     History  Substance Use Topics  . Smoking status: Never Smoker   . Smokeless tobacco: Never Used  . Alcohol Use: Yes     Comment: OCC    OB History    Grav Para Term Preterm Abortions TAB SAB Ect Mult Living   0               Review of Systems  Gastrointestinal: Positive for nausea.   Neurological: Positive for light-headedness and headaches.  All other systems reviewed and are negative.    Allergies  Clindamycin/lincomycin and Zithromax  Home Medications   Current Outpatient Rx  Name  Route  Sig  Dispense  Refill  . EXCEDRIN PO   Oral   Take 2 tablets by mouth every 6 (six) hours as needed. For headaches         . BC HEADACHE POWDER PO   Oral   Take 1 packet by mouth daily as needed. For headaches         . CLOBETASOL PROPIONATE 0.05 % EX CREA   Topical   Apply topically 2 (two) times daily.   60 g   1   . FLUTICASONE PROPIONATE 50 MCG/ACT NA SUSP   Nasal   Place 2 sprays into the nose daily.   16 g   2   . HYDROCODONE-ACETAMINOPHEN 5-500 MG PO TABS   Oral   Take 1 tablet by mouth every 6 (six) hours as needed. For pain   10 tablet   0   . HYDROXYZINE HCL 25 MG PO TABS   Oral   Take 1 tablet (25 mg total) by mouth 3 (three) times daily as needed for itching.   30 tablet   0   . ONDANSETRON HCL 4 MG PO TABS   Oral  Take 1 tablet (4 mg total) by mouth every 8 (eight) hours as needed. For nausea   20 tablet   0   . PROMETHAZINE HCL 25 MG PO TABS   Oral   Take 1 tablet (25 mg total) by mouth every 8 (eight) hours as needed for nausea.   20 tablet   0   . PSEUDOEPHEDRINE-ACETAMINOPHEN 30-500 MG PO TABS   Oral   Take 1 tablet by mouth every 4 (four) hours as needed.         Marland Kitchen RIZATRIPTAN BENZOATE 10 MG PO TABS   Oral   Take 1 tablet (10 mg total) by mouth as needed for migraine. May repeat in 2 hours if needed   10 tablet   5   . SALINE NASAL SPRAY 0.65 % NA SOLN   Nasal   Place 1 spray into the nose as needed for congestion.   30 mL   12     BP 128/79  Pulse 60  Temp 98.1 F (36.7 C) (Oral)  Resp 19  SpO2 100%  LMP 09/07/2012  Physical Exam  Nursing note and vitals reviewed. Constitutional: She is oriented to person, place, and time. Vital signs are normal. She appears well-developed and well-nourished. She  is active and cooperative.  HENT:  Head: Normocephalic.  Right Ear: External ear normal. Tympanic membrane is bulging.  Left Ear: Tympanic membrane and external ear normal.  Nose: Right sinus exhibits maxillary sinus tenderness. Right sinus exhibits no frontal sinus tenderness. Left sinus exhibits no maxillary sinus tenderness and no frontal sinus tenderness.  Mouth/Throat: Uvula is midline, oropharynx is clear and moist and mucous membranes are normal.  Eyes: Conjunctivae normal and EOM are normal. Pupils are equal, round, and reactive to light. No scleral icterus.  Neck: Trachea normal, normal range of motion and full passive range of motion without pain. Neck supple. Normal carotid pulses present. No spinous process tenderness and no muscular tenderness present. Carotid bruit is not present.  Cardiovascular: Normal rate, regular rhythm, normal heart sounds, intact distal pulses and normal pulses.   Pulmonary/Chest: Effort normal and breath sounds normal.  Musculoskeletal: Normal range of motion.  Lymphadenopathy:       Head (right side): No submental, no submandibular, no tonsillar, no preauricular, no posterior auricular and no occipital adenopathy present.       Head (left side): No submental, no submandibular, no tonsillar, no preauricular, no posterior auricular and no occipital adenopathy present.    She has no cervical adenopathy.       Right: No supraclavicular adenopathy present.       Left: No supraclavicular adenopathy present.  Neurological: She is alert and oriented to person, place, and time. She has normal strength and normal reflexes. No cranial nerve deficit or sensory deficit. Coordination normal. GCS eye subscore is 4. GCS verbal subscore is 5. GCS motor subscore is 6.  Skin: Skin is warm and dry. No rash noted.  Psychiatric: She has a normal mood and affect. Her speech is normal and behavior is normal. Judgment and thought content normal. Cognition and memory are normal.     ED Course  Procedures (including critical care time)  Labs Reviewed - No data to display No results found.   1. Migraine   2. Sinusitis, acute       MDM  Toradol 60mg  IM and Zofran 8mg  ODT administered in office. 1755 Pain is decreased to 6/10.   1844 vicodin administered for pain, pt request to leave,  RX refill for medications provided.  Pt reports good follow up with her neurologist.         Johnsie Kindred, NP 09/21/12 1845

## 2012-09-21 NOTE — ED Notes (Signed)
Pt c/o migraines since yest eve Hx of migraines; will usually last for 4 days Sx today include: weakness, nauseas, sensitive to light and noise Took maxalt, zofran and vicodin today for the migraine Has run out of maxalt and doesn't have much of zofran and vicodin  She is alert and responsive; lying on the exam table w/lights dimmed.

## 2012-09-21 NOTE — ED Provider Notes (Signed)
Medical screening examination/treatment/procedure(s) were performed by non-physician practitioner and as supervising physician I was immediately available for consultation/collaboration.  Marveen Donlon, M.D.   Damian Hofstra C Tekelia Kareem, MD 09/21/12 2033 

## 2012-10-12 ENCOUNTER — Ambulatory Visit: Payer: Self-pay | Admitting: Family Medicine

## 2012-10-22 ENCOUNTER — Ambulatory Visit (INDEPENDENT_AMBULATORY_CARE_PROVIDER_SITE_OTHER): Payer: 59 | Admitting: Emergency Medicine

## 2012-10-22 VITALS — BP 105/71 | HR 81 | Temp 98.7°F | Resp 18 | Ht 63.5 in | Wt 195.2 lb

## 2012-10-22 DIAGNOSIS — J020 Streptococcal pharyngitis: Secondary | ICD-10-CM

## 2012-10-22 MED ORDER — PENICILLIN V POTASSIUM 500 MG PO TABS: 500.0000 mg | ORAL_TABLET | Freq: Four times a day (QID) | ORAL | Status: DC

## 2012-10-22 MED ORDER — HYDROCODONE-ACETAMINOPHEN 5-500 MG PO TABS: 1.0000 | ORAL_TABLET | ORAL | Status: DC | PRN

## 2012-10-22 NOTE — Addendum Note (Signed)
Addended by: Cydney Ok on: 10/22/2012 06:41 PM   Modules accepted: Orders

## 2012-10-22 NOTE — Patient Instructions (Addendum)
Strep Infections  Streptococcal (strep) infections are caused by streptococcal germs (bacteria). Strep infections are very contagious. Strep infections can occur in:   Ears.   The nose.   The throat.   Sinuses.   Skin.   Blood.   Lungs.   Spinal fluid.   Urine.  Strep throat is the most common bacterial infection in children. The symptoms of a Strep infection usually get better in 2 to 3 days after starting medicine that kills germs (antibiotics). Strep is usually not contagious after 36 to 48 hours of antibiotic treatment. Strep infections that are not treated can cause serious complications. These include gland infections, throat abscess, rheumatic fever and kidney disease.  DIAGNOSIS   The diagnosis of strep is made by:   A culture for the strep germ.  TREATMENT   These infections require oral antibiotics for a full 10 days, an antibiotic shot or antibiotics given into the vein (intravenous, IV).  HOME CARE INSTRUCTIONS    Be sure to finish all antibiotics even if feeling better.   Only take over-the-counter medicines for pain, discomfort and or fever, as directed by your caregiver.   Close contacts that have a fever, sore throat or illness symptoms should see their caregiver right away.   You or your child may return to work, school or daycare if the fever and pain are better in 2 to 3 days after starting antibiotics.  SEEK MEDICAL CARE IF:    You or your child has an oral temperature above 102 F (38.9 C).   Your baby is older than 3 months with a rectal temperature of 100.5 F (38.1 C) or higher for more than 1 day.   You or your child is not better in 3 days.  SEEK IMMEDIATE MEDICAL CARE IF:    You or your child has an oral temperature above 102 F (38.9 C), not controlled by medicine.   Your baby is older than 3 months with a rectal temperature of 102 F (38.9 C) or higher.   Your baby is 3 months old or younger with a rectal temperature of 100.4 F (38 C) or higher.   There is a  spreading rash.   There is difficulty swallowing or breathing.   There is increased pain or swelling.  Document Released: 09/16/2004 Document Revised: 11/01/2011 Document Reviewed: 06/25/2009  ExitCare Patient Information 2013 [employer].

## 2012-10-22 NOTE — Progress Notes (Signed)
[facility] [address] [place] [address] [phone]  Date:  10/22/2012   Name:  Theresa Miranda   DOB:  Jan 17, 1971   MRN:  [id number]  PCP:  Noralee Stain, MD    Chief Complaint: Generalized Body Aches and Sore Throat   History of Present Illness:  Theresa Miranda is a 42 y.o. very pleasant female patient who presents with the following:  Ill since Wednesday with sore throat and fever. Saw FMD on Friday who put her on augmentin.  She now has persistent sore throat and a sensation that there is thick mucous in her throat that is difficult to mobilize.  Temp is normalizing but has persistent myalgias and arthralgias and fatigue.  No nausea or vomiting.  No cough or coryza.  No improvement with OTC medications.  Patient Active Problem List  Diagnosis  . Fibroid uterus  . CIN I (cervical intraepithelial neoplasia I)  . Weight gain    Past Medical History  Diagnosis Date  . Depression   . Migraines   . Allergy   . Anemia   . Anxiety     Past Surgical History  Procedure Laterality Date  . Tonsillectomy and adenoidectomy  1979  . Carpal tunnel release  1999  . Cervical cerclage  1991, 1996  . Tubal ligation    . Uterine ablation    . Appendectomy      History  Substance Use Topics  . Smoking status: Never Smoker   . Smokeless tobacco: Never Used  . Alcohol Use: Yes     Comment: OCC    Family History  Problem Relation Age of Onset  . Cancer Maternal Grandmother     lung  . Cancer Maternal Grandfather     prostate  . Alcohol abuse Mother   . Cancer Paternal Grandmother     lung  . Stroke Paternal Grandfather     Allergies  Allergen Reactions  . Clindamycin/Lincomycin Anaphylaxis    Pt has "anaphylaxis" to Clindamycin per [facility] pharmacist.  . Zithromax (Azithromycin)     Medication list has been reviewed and updated.  Current Outpatient Prescriptions on File Prior to Visit  Medication Sig Dispense Refill   . Aspirin-Acetaminophen-Caffeine (EXCEDRIN PO) Take 2 tablets by mouth every 6 (six) hours as needed. For headaches      . Aspirin-Salicylamide-Caffeine (BC HEADACHE POWDER PO) Take 1 packet by mouth daily as needed. For headaches      . fluticasone (FLONASE) 50 MCG/ACT nasal spray Place 2 sprays into the nose daily.  16 g  2  . HYDROcodone-acetaminophen (VICODIN) 5-500 MG per tablet Take 1 tablet by mouth every 6 (six) hours as needed. For pain  10 tablet  0  . ondansetron (ZOFRAN) 4 MG tablet Take 1 tablet (4 mg total) by mouth every 8 (eight) hours as needed. For nausea  20 tablet  0  . promethazine (PHENERGAN) 25 MG tablet Take 1 tablet (25 mg total) by mouth every 8 (eight) hours as needed for nausea.  20 tablet  0  . rizatriptan (MAXALT) 10 MG tablet Take 1 tablet (10 mg total) by mouth as needed for migraine. May repeat in 2 hours if needed  10 tablet  5  . sodium chloride (OCEAN NASAL SPRAY) 0.65 % nasal spray Place 1 spray into the nose as needed for congestion.  30 mL  12  . clobetasol cream (TEMOVATE) 0.05 % Apply topically 2 (two) times daily.  60 g  1  . hydrOXYzine (ATARAX/VISTARIL) 25 MG tablet Take 1 tablet (25 mg total) by mouth 3 (three) times daily as needed for itching.  30 tablet  0  . pseudoephedrine-acetaminophen (TYLENOL SINUS) 30-500 MG TABS Take 1 tablet by mouth every 4 (four) hours as needed.       Current Facility-Administered Medications on File Prior to Visit  Medication Dose Route Frequency Provider Last Rate Last Dose  . lidocaine (XYLOCAINE) 1 % injection 12 mL  12 mL Infiltration Once Ok Edwards, MD        Review of Systems:  As per HPI, otherwise negative.    Physical Examination: Filed Vitals:   10/22/12 1710  BP: 105/71  Pulse: 81  Temp: 98.7 F (37.1 C)  Resp: 18   Filed Vitals:   10/22/12 1710  Height: 5' 3.5" (1.613 m)  Weight: 195 lb 3.2 oz (88.542 kg)   Body mass index is 34.03 kg/(m^2). Ideal Body Weight: Weight in (lb) to have  BMI = 25: 143.1  GEN: WDWN, NAD, Non-toxic, A & O x 3 HEENT: Atraumatic, Normocephalic. Neck supple. No masses, anterior cervical tender LAD. Ears and Nose: No external deformity. CV: RRR, No M/G/R. No JVD. No thrill. No extra heart sounds. PULM: CTA B, no wheezes, crackles, rhonchi. No retractions. No resp. distress. No accessory muscle use. ABD: S, NT, ND, +BS. No rebound. No HSM. EXTR: No c/c/e NEURO Normal gait.  PSYCH: Normally interactive. Conversant. Not depressed or anxious appearing.  Calm demeanor.    Assessment and Plan: Strep throat Stop augmentin penvk Follow up as needed  Carmelina Dane, MD

## 2012-10-23 ENCOUNTER — Other Ambulatory Visit: Payer: Self-pay

## 2012-10-23 MED ORDER — HYDROCODONE-ACETAMINOPHEN 5-325 MG PO TABS: 1.0000 | ORAL_TABLET | Freq: Four times a day (QID) | ORAL | Status: DC | PRN

## 2012-10-23 NOTE — Telephone Encounter (Signed)
Dosed changed by Sammuel Cooper.  Please sign and update your favorites.

## 2012-10-27 ENCOUNTER — Ambulatory Visit: Payer: Self-pay | Admitting: Family Medicine

## 2012-11-21 ENCOUNTER — Ambulatory Visit: Payer: Self-pay | Admitting: Pain Medicine

## 2012-11-30 ENCOUNTER — Emergency Department (HOSPITAL_COMMUNITY)
Admission: EM | Admit: 2012-11-30 | Discharge: 2012-11-30 | Disposition: A | Discharge status: Home / Self Care | Payer: 59 | Attending: Emergency Medicine | Admitting: Emergency Medicine

## 2012-11-30 ENCOUNTER — Encounter (HOSPITAL_COMMUNITY): Payer: Self-pay | Admitting: Emergency Medicine

## 2012-11-30 DIAGNOSIS — Z8659 Personal history of other mental and behavioral disorders: Secondary | ICD-10-CM | POA: Insufficient documentation

## 2012-11-30 DIAGNOSIS — E860 Dehydration: Secondary | ICD-10-CM | POA: Insufficient documentation

## 2012-11-30 DIAGNOSIS — G43909 Migraine, unspecified, not intractable, without status migrainosus: Secondary | ICD-10-CM

## 2012-11-30 DIAGNOSIS — H53149 Visual discomfort, unspecified: Secondary | ICD-10-CM | POA: Insufficient documentation

## 2012-11-30 DIAGNOSIS — Z862 Personal history of diseases of the blood and blood-forming organs and certain disorders involving the immune mechanism: Secondary | ICD-10-CM | POA: Insufficient documentation

## 2012-11-30 DIAGNOSIS — R110 Nausea: Secondary | ICD-10-CM | POA: Insufficient documentation

## 2012-11-30 MED ORDER — KETOROLAC TROMETHAMINE 30 MG/ML IJ SOLN
30.0000 mg | Freq: Once | INTRAMUSCULAR | Status: AC
  Administered 2012-11-30: 30 mg via INTRAVENOUS
  Filled 2012-11-30: qty 1

## 2012-11-30 MED ORDER — METOCLOPRAMIDE HCL 5 MG/ML IJ SOLN
10.0000 mg | Freq: Once | INTRAMUSCULAR | Status: AC
  Administered 2012-11-30: 10 mg via INTRAVENOUS
  Filled 2012-11-30: qty 2

## 2012-11-30 MED ORDER — SODIUM CHLORIDE 0.9 % IV SOLN
INTRAVENOUS | Status: DC
  Administered 2012-11-30: 21:00:00 via INTRAVENOUS

## 2012-11-30 MED ORDER — DIPHENHYDRAMINE HCL 50 MG/ML IJ SOLN
25.0000 mg | Freq: Once | INTRAMUSCULAR | Status: AC
  Administered 2012-11-30: 25 mg via INTRAVENOUS
  Filled 2012-11-30: qty 1

## 2012-11-30 NOTE — ED Notes (Signed)
Pt c/o migraine HA to right side of head x 2 days; pt sts hx of same with nausea

## 2012-11-30 NOTE — ED Notes (Signed)
Attempted to obtain e-signature.  Signature pad not working in room.  Ticket put to get signature pad fixed.

## 2012-11-30 NOTE — ED Provider Notes (Signed)
History     CSN: [id number]  Arrival date & time 11/30/12  1649   First MD Initiated Contact with Patient 11/30/12 2008      Chief Complaint  Patient presents with  . Headache    (Consider location/radiation/quality/duration/timing/severity/associated sxs/prior treatment) HPI  SUBJECTIVE:  Theresa Miranda is a 42 y.o. female who complains of migraine headache for 2 day(s). She has a well established history of recurrent migraines. Description of pain: throbbing pain, unilateral in the right frontal area, unilateral in the right temporal area. Associated symptoms: dehydration, nausea and phonophobia. Patient has already taken Maxalt and hydrocodone for this headache without relief. Denies visual changes, stiff neck, neck pain, rash, or "thunderclap" onset.  Denies fevers, chills, myalgias, arthralgias. Denies DOE, SOB, chest tightness or pressure, radiation to left arm, jaw or back, or diaphoresis. Denies dysuria, flank pain, suprapubic pain, frequency, urgency, or hematuria. Denies light headedness, weakness, visual disturbances. Denies abdominal pain, nausea, vomiting, diarrhea or constipation.     Current Facility-Administered Medications  Medication Dose Route Frequency Provider Last Rate Last Dose  . 0.9 %  sodium chloride infusion   Intravenous Continuous Arthor Captain, PA-C      . diphenhydrAMINE (BENADRYL) injection 25 mg  25 mg Intravenous Once [employer], PA-C      . ketorolac (TORADOL) 30 MG/ML injection 30 mg  30 mg Intravenous Once [employer], PA-C      . lidocaine (XYLOCAINE) 1 % injection 12 mL  12 mL Infiltration Once Ok Edwards, MD      . metoCLOPramide (REGLAN) injection 10 mg  10 mg Intravenous Once Arthor Captain, PA-C       Current Outpatient Prescriptions  Medication Sig Dispense Refill  . Aspirin-Salicylamide-Caffeine (BC HEADACHE POWDER PO) Take 1 packet by mouth daily as needed. For headaches      . fluticasone (FLONASE) 50 MCG/ACT nasal  spray Place 2 sprays into the nose daily.  16 g  2  . HYDROcodone-acetaminophen (NORCO/VICODIN) 5-325 MG per tablet Take 1 tablet by mouth every 6 (six) hours as needed for pain.  20 tablet  0  . ondansetron (ZOFRAN) 4 MG tablet Take 1 tablet (4 mg total) by mouth every 8 (eight) hours as needed. For nausea  20 tablet  0  . promethazine (PHENERGAN) 25 MG tablet Take 1 tablet (25 mg total) by mouth every 8 (eight) hours as needed for nausea.  20 tablet  0  . rizatriptan (MAXALT) 10 MG tablet Take 1 tablet (10 mg total) by mouth as needed for migraine. May repeat in 2 hours if needed  10 tablet  5  . sodium chloride (OCEAN NASAL SPRAY) 0.65 % nasal spray Place 1 spray into the nose as needed for congestion.  30 mL  12      Past Medical History  Diagnosis Date  . Depression   . Migraines   . Allergy   . Anemia   . Anxiety     Past Surgical History  Procedure Laterality Date  . Tonsillectomy and adenoidectomy  1979  . Carpal tunnel release  1999  . Cervical cerclage  1991, 1996  . Tubal ligation    . Uterine ablation    . Appendectomy      Family History  Problem Relation Age of Onset  . Cancer Maternal Grandmother     lung  . Cancer Maternal Grandfather     prostate  . Alcohol abuse Mother   . Cancer Paternal Grandmother  lung  . Stroke Paternal Grandfather     History  Substance Use Topics  . Smoking status: Never Smoker   . Smokeless tobacco: Never Used  . Alcohol Use: Yes     Comment: OCC    OB History   Grav Para Term Preterm Abortions TAB SAB Ect Mult Living   0               Review of Systems Ten systems reviewed and are negative for acute change, except as noted in the HPI.   Allergies  Clindamycin/lincomycin and Zithromax  Home Medications   Current Outpatient Rx  Name  Route  Sig  Dispense  Refill  . Aspirin-Salicylamide-Caffeine (BC HEADACHE POWDER PO)   Oral   Take 1 packet by mouth daily as needed. For headaches         . fluticasone  (FLONASE) 50 MCG/ACT nasal spray   Nasal   Place 2 sprays into the nose daily.   16 g   2   . HYDROcodone-acetaminophen (NORCO/VICODIN) 5-325 MG per tablet   Oral   Take 1 tablet by mouth every 6 (six) hours as needed for pain.   20 tablet   0   . ondansetron (ZOFRAN) 4 MG tablet   Oral   Take 1 tablet (4 mg total) by mouth every 8 (eight) hours as needed. For nausea   20 tablet   0   . promethazine (PHENERGAN) 25 MG tablet   Oral   Take 1 tablet (25 mg total) by mouth every 8 (eight) hours as needed for nausea.   20 tablet   0   . rizatriptan (MAXALT) 10 MG tablet   Oral   Take 1 tablet (10 mg total) by mouth as needed for migraine. May repeat in 2 hours if needed   10 tablet   5   . sodium chloride (OCEAN NASAL SPRAY) 0.65 % nasal spray   Nasal   Place 1 spray into the nose as needed for congestion.   30 mL   12     BP 113/66  Pulse 72  Temp(Src) 98.3 F (36.8 C) (Oral)  Resp 14  SpO2 99%  Physical Exam  Nursing note and vitals reviewed. Constitutional: She is oriented to person, place, and time. She appears well-developed and well-nourished. No distress.  Patient is resting in the dark with no television.  She appears very uncomfortable.  She does not open her eyes to speak.  Speech is soft.  HENT:  Head: Normocephalic and atraumatic.  Eyes: Conjunctivae and EOM are normal. Pupils are equal, round, and reactive to light. No scleral icterus.  Neck: Normal range of motion.  Cardiovascular: Normal rate, regular rhythm and normal heart sounds.  Exam reveals no gallop and no friction rub.   No murmur heard. Pulmonary/Chest: Effort normal and breath sounds normal. No respiratory distress.  Abdominal: Soft. Bowel sounds are normal. She exhibits no distension and no mass. There is no tenderness. There is no guarding.  Neurological: She is alert and oriented to person, place, and time. No cranial nerve deficit. Coordination normal.  Skin: Skin is warm and dry. She  is not diaphoretic.    ED Course  Procedures (including critical care time)  Labs Reviewed - No data to display No results found.   1. Migraine       MDM  8:56 PM Filed Vitals:   11/30/12 1659  BP: 113/66  Pulse: 72  Temp: 98.3 F (36.8 C)  Resp:  14   Patient with normal symptoms of her migraine headache.  She was unable to control her symptoms at home with her abortive therapy.  We will treat the patient with migraine cocktail and reevaluate.  I do not suspect subarachnoid hemorrhage, meningitis, or other acute intracranial abnormality.   10:05 PM Pt HA treated and improved while in ED.  Presentation is like pts typical HA and non concerning for [facility], [facility], Meningitis, or temporal arteritis. Pt is afebrile with no focal neuro deficits, nuchal rigidity, or change in vision. Patient is asking to go home. She is to follow up with PCP to discuss prophylactic medication. Pt verbalizes understanding and is agreeable with plan to dc.       Arthor Captain, PA-C 11/30/12 2208

## 2012-12-01 NOTE — ED Provider Notes (Signed)
Medical screening examination/treatment/procedure(s) were performed by non-physician practitioner and as supervising physician I was immediately available for consultation/collaboration.  Juliet Rude. Rubin Payor, MD 12/01/12 9604

## 2012-12-04 ENCOUNTER — Ambulatory Visit: Payer: Self-pay | Admitting: Pain Medicine

## 2012-12-14 ENCOUNTER — Ambulatory Visit: Payer: Self-pay | Admitting: Pain Medicine

## 2013-01-01 ENCOUNTER — Ambulatory Visit: Payer: Self-pay | Admitting: Pain Medicine

## 2013-01-11 ENCOUNTER — Ambulatory Visit: Payer: Self-pay | Admitting: Pain Medicine

## 2013-02-05 ENCOUNTER — Ambulatory Visit: Payer: Self-pay | Admitting: Pain Medicine

## 2013-02-14 ENCOUNTER — Encounter: Payer: Self-pay | Admitting: Physical Medicine & Rehabilitation

## 2013-02-20 ENCOUNTER — Ambulatory Visit: Payer: Self-pay | Admitting: Pain Medicine

## 2013-03-16 ENCOUNTER — Encounter: Payer: 59 | Attending: Physical Medicine & Rehabilitation

## 2013-03-16 ENCOUNTER — Ambulatory Visit (HOSPITAL_BASED_OUTPATIENT_CLINIC_OR_DEPARTMENT_OTHER): Payer: 59 | Admitting: Physical Medicine & Rehabilitation

## 2013-03-16 ENCOUNTER — Encounter: Payer: Self-pay | Admitting: Physical Medicine & Rehabilitation

## 2013-03-16 VITALS — BP 128/69 | HR 73 | Resp 16 | Ht 62.0 in | Wt 199.0 lb

## 2013-03-16 DIAGNOSIS — G43709 Chronic migraine without aura, not intractable, without status migrainosus: Secondary | ICD-10-CM | POA: Insufficient documentation

## 2013-03-16 DIAGNOSIS — G43019 Migraine without aura, intractable, without status migrainosus: Secondary | ICD-10-CM

## 2013-03-16 NOTE — Progress Notes (Signed)
Subjective:    Patient ID: Theresa Miranda, female    DOB: Nov 16, 1970, 42 y.o.   MRN: [id number] Reason for visit: Evaluate use of Botox for migraine headache prophylaxis HPI Diagnosed with migraines by neurology about 15 yrs ago.  Had problems with headaches since age 16.  Has had 3 CT scans of the head, most recent about 2 years ago. No abnormalities on CT scan. Has migraine headache 5/7 days a week. They last greater than 4 hours per day . Headaches are accompanied by sensitivity to light and sound as well as nausea. She has chronic right visual field blind spots. Tried verapamil without help and had hypotension at 180mg  Tried Topamax which caused drowsiness and speech problems. Did not help prevent headaches. Tried amitriptyline which did not help prevent headaches.  Tried Botox injections from the neurologist in [place].  Headache frequency was reduced by 50%. Received 2 sets of injections. Both injections were helpful. Injections were performed in the frontal area as well as temporal area as well as occipital and posterior cervical area. Pain Inventory Average Pain 7 Pain Right Now 6 My pain is intermittent, constant, sharp, dull, stabbing and aching  In the last 24 hours, has pain interfered with the following? General activity 7 Relation with others 8 Enjoyment of life 8 What TIME of day is your pain at its worst? evening Sleep (in general) Fair  Pain is worse with: walking, bending and some activites Pain improves with: rest, medication and injections Relief from Meds: 7  Mobility walk without assistance ability to climb steps?  yes do you drive?  yes Do you have any goals in this area?  no  Function employed # of hrs/week 40-45  Neuro/Psych No problems in this area  Prior Studies Any changes since last visit?  no  Physicians involved in your care Any changes since last visit?  yes Primary care Dr. Dennison Mascot   Family History  Problem Relation Age of  Onset  . Cancer Maternal Grandmother     lung  . Cancer Maternal Grandfather     prostate  . Alcohol abuse Mother   . Cancer Paternal Grandmother     lung  . Stroke Paternal Grandfather    History   Social History  . Marital Status: Widowed    Spouse Name: N/A    Number of Children: N/A  . Years of Education: N/A   Occupational History  . A/R Supervisor [employer]   Social History Main Topics  . Smoking status: Never Smoker   . Smokeless tobacco: Never Used  . Alcohol Use: Yes     Comment: OCC  . Drug Use: Yes  . Sexually Active: Yes    Birth Control/ Protection: Surgical     Comment: TUBAL LIGATION   Other Topics Concern  . None   Social History Narrative  . None   Past Surgical History  Procedure Laterality Date  . Tonsillectomy and adenoidectomy  1979  . Carpal tunnel release  1999  . Cervical cerclage  1991, 1996  . Tubal ligation    . Uterine ablation    . Appendectomy     Past Medical History  Diagnosis Date  . Depression   . Migraines   . Allergy   . Anemia   . Anxiety    BP 128/69  Pulse 73  Resp 16  Ht 5\' 2"  (1.575 m)  Wt 199 lb (90.266 kg)  BMI 36.39 kg/m2  SpO2 99%  LMP 03/02/2013  Review of Systems  Cardiovascular: Positive for leg swelling.  Gastrointestinal: Positive for nausea.  All other systems reviewed and are negative.       Objective:   Physical Exam  Neck: Normal range of motion. Neck supple.  Psychiatric: She has a normal mood and affect. Her behavior is normal. Judgment and thought content normal.    Neuro:  Eyes withoutnystagmus  Tone is normal without evidence of spasticity Cerebellar exam shows no evidence of ataxia on finger nose finger or heel to shin testing No evidence of trunkal ataxia  Motor strength is 5/5 in bilateral deltoid, biceps, triceps, finger flexors and extensors, wrist flexors and extensors, hip flexors, knee flexors and extensors, ankle dorsiflexors, plantar flexors, invertors and  evertors, toe flexors and extensors  Sensory exam is normal to pinprick, proprioception and light touch in the upper and lower limbs   Cranial nerves II- Visual fields are intact to confrontation testing, no blurring of vision III- no evidence of ptosis, upward, downward and medial gaze intact IV- no vertical diplopia or head tilt V- no facial numbness or masseter weakness VI- no pupil abduction weakness VII- no facial droop, good lid closure VII- normal auditory acuity IX- no pharygeal weakness, gag nl X- no pharyngeal weakness, no hoarseness XI- no trap or SCM weakness XII- no glossal weakness        Assessment & Plan:  1.  Chronic Migraine Headache has failed oral prophylactic agents such as amitriptyline, Topamax. Has responded in the past to Botox injection. Recommend repeat with 200 units.

## 2013-03-16 NOTE — Patient Instructions (Addendum)
Please refer to Botox patient information. Injection may cause eyelid drooping

## 2013-03-19 ENCOUNTER — Encounter: Payer: 59 | Admitting: Gynecology

## 2013-03-19 ENCOUNTER — Ambulatory Visit: Payer: Self-pay | Admitting: Pain Medicine

## 2013-03-24 ENCOUNTER — Encounter (HOSPITAL_COMMUNITY): Payer: Self-pay

## 2013-03-24 ENCOUNTER — Emergency Department (HOSPITAL_COMMUNITY)
Admission: EM | Admit: 2013-03-24 | Discharge: 2013-03-24 | Disposition: A | Discharge status: Home / Self Care | Payer: 59 | Source: Home / Self Care

## 2013-03-24 DIAGNOSIS — J309 Allergic rhinitis, unspecified: Secondary | ICD-10-CM

## 2013-03-24 DIAGNOSIS — J029 Acute pharyngitis, unspecified: Secondary | ICD-10-CM

## 2013-03-24 DIAGNOSIS — J019 Acute sinusitis, unspecified: Secondary | ICD-10-CM

## 2013-03-24 DIAGNOSIS — G43909 Migraine, unspecified, not intractable, without status migrainosus: Secondary | ICD-10-CM

## 2013-03-24 MED ORDER — PHENYLEPHRINE-CHLORPHEN-DM 10-4-12.5 MG/5ML PO LIQD: 5.0000 mL | ORAL | Status: DC | PRN

## 2013-03-24 NOTE — ED Provider Notes (Signed)
Medical screening examination/treatment/procedure(s) were performed by non-physician practitioner and as supervising physician I was immediately available for consultation/collaboration.  Leslee Home, M.D.  Reuben Likes, MD 03/24/13 2015

## 2013-03-24 NOTE — ED Notes (Addendum)
Reports 2 day duration of ST (Lside worse than right ) , pain left ear uvula midline, posterior nasopharynx reddened ; also has reported migraine HA

## 2013-03-24 NOTE — ED Provider Notes (Signed)
CSN: [id number]     Arrival date & time 03/24/13  1449 History     First MD Initiated Contact with Patient 03/24/13 1537     Chief Complaint  Patient presents with  . Sore Throat   (Consider location/radiation/quality/duration/timing/severity/associated sxs/prior Treatment) HPI Comments: 42 year old female presents with a sore throat for 2 days. It is associated with discomfort in the left ear and PND. She denies fever.   Past Medical History  Diagnosis Date  . Depression   . Migraines   . Allergy   . Anemia   . Anxiety    Past Surgical History  Procedure Laterality Date  . Tonsillectomy and adenoidectomy  1979  . Carpal tunnel release  1999  . Cervical cerclage  1991, 1996  . Tubal ligation    . Uterine ablation    . Appendectomy     Family History  Problem Relation Age of Onset  . Cancer Maternal Grandmother     lung  . Cancer Maternal Grandfather     prostate  . Alcohol abuse Mother   . Cancer Paternal Grandmother     lung  . Stroke Paternal Grandfather    History  Substance Use Topics  . Smoking status: Never Smoker   . Smokeless tobacco: Never Used  . Alcohol Use: Yes     Comment: OCC   OB History   Grav Para Term Preterm Abortions TAB SAB Ect Mult Living   0              Review of Systems  Constitutional: Negative for fever, activity change and fatigue.  HENT: Positive for ear pain, sore throat, rhinorrhea and postnasal drip. Negative for neck pain and ear discharge.   Eyes: Negative.   Respiratory: Negative for shortness of breath.   Cardiovascular: Negative for chest pain.  Gastrointestinal: Negative.   Musculoskeletal: Negative.     Allergies  Clindamycin/lincomycin and Zithromax  Home Medications   Current Outpatient Rx  Name  Route  Sig  Dispense  Refill  . HYDROcodone-acetaminophen (NORCO/VICODIN) 5-325 MG per tablet   Oral   Take 1 tablet by mouth every 6 (six) hours as needed for pain.   20 tablet   0   . Iron-Vitamin C  (VITRON-C) 65-125 MG TABS   Oral   Take by mouth.         . rizatriptan (MAXALT) 10 MG tablet   Oral   Take 1 tablet (10 mg total) by mouth as needed for migraine. May repeat in 2 hours if needed   10 tablet   5   . Aspirin-Salicylamide-Caffeine (BC HEADACHE POWDER PO)   Oral   Take 1 packet by mouth daily as needed. For headaches         . fluticasone (FLONASE) 50 MCG/ACT nasal spray   Nasal   Place 2 sprays into the nose daily.   16 g   2   . ondansetron (ZOFRAN) 4 MG tablet   Oral   Take 1 tablet (4 mg total) by mouth every 8 (eight) hours as needed. For nausea   20 tablet   0   . Phenylephrine-Chlorphen-DM 05-27-11.5 MG/5ML LIQD   Oral   Take 5 mLs by mouth every 4 (four) hours as needed. May cause drowsiness   120 mL   0   . promethazine (PHENERGAN) 25 MG tablet   Oral   Take 1 tablet (25 mg total) by mouth every 8 (eight) hours as needed for nausea.   20  tablet   0   . sodium chloride (OCEAN NASAL SPRAY) 0.65 % nasal spray   Nasal   Place 1 spray into the nose as needed for congestion.   30 mL   12    BP 122/65  Pulse 72  Temp(Src) 98.5 F (36.9 C) (Oral)  Resp 20  SpO2 100%  LMP 03/02/2013 Physical Exam  Nursing note and vitals reviewed. Constitutional: She is oriented to person, place, and time. She appears well-developed and well-nourished. No distress.  HENT:  Right TM is normal left TM with minor retraction. No erythema or effusion. Oropharynx with posterior pharyngeal erythema and mild cobblestoning with clear PND. No exudates.  Eyes: Conjunctivae and EOM are normal.  Neck: Normal range of motion. Neck supple.  Cardiovascular: Normal rate, regular rhythm and normal heart sounds.   Pulmonary/Chest: Effort normal and breath sounds normal. No respiratory distress.  Abdominal: Soft. There is no tenderness.  Lymphadenopathy:    She has no cervical adenopathy.  Neurological: She is alert and oriented to person, place, and time. She exhibits  normal muscle tone.  Skin: Skin is warm and dry. No rash noted.  Psychiatric: She has a normal mood and affect.    ED Course   Procedures (including critical care time)  Labs Reviewed  CULTURE, GROUP A STREP  POCT RAPID STREP A ([facility] CARE ONLY)   No results found. 1. Allergic rhinitis   2. Allergic pharyngitis     MDM  Norel CS as directed Plenty of fluids CHloroseptic spray Read instructions Follow with your doctor next week  Hayden Rasmussen, NP 03/24/13 1649

## 2013-03-26 LAB — CULTURE, GROUP A STREP

## 2013-03-29 ENCOUNTER — Encounter (HOSPITAL_COMMUNITY): Payer: Self-pay | Admitting: Emergency Medicine

## 2013-03-29 ENCOUNTER — Encounter: Payer: 59 | Admitting: Gynecology

## 2013-03-29 ENCOUNTER — Emergency Department (HOSPITAL_COMMUNITY)
Admission: EM | Admit: 2013-03-29 | Discharge: 2013-03-29 | Disposition: A | Discharge status: Home / Self Care | Payer: 59 | Attending: Emergency Medicine | Admitting: Emergency Medicine

## 2013-03-29 DIAGNOSIS — Z862 Personal history of diseases of the blood and blood-forming organs and certain disorders involving the immune mechanism: Secondary | ICD-10-CM | POA: Insufficient documentation

## 2013-03-29 DIAGNOSIS — R110 Nausea: Secondary | ICD-10-CM | POA: Insufficient documentation

## 2013-03-29 DIAGNOSIS — H53149 Visual discomfort, unspecified: Secondary | ICD-10-CM | POA: Insufficient documentation

## 2013-03-29 DIAGNOSIS — G43909 Migraine, unspecified, not intractable, without status migrainosus: Secondary | ICD-10-CM | POA: Insufficient documentation

## 2013-03-29 DIAGNOSIS — Z8659 Personal history of other mental and behavioral disorders: Secondary | ICD-10-CM | POA: Insufficient documentation

## 2013-03-29 MED ORDER — METOCLOPRAMIDE HCL 5 MG/ML IJ SOLN
10.0000 mg | Freq: Once | INTRAMUSCULAR | Status: AC
  Administered 2013-03-29: 10 mg via INTRAVENOUS
  Filled 2013-03-29: qty 2

## 2013-03-29 MED ORDER — SODIUM CHLORIDE 0.9 % IV BOLUS (SEPSIS)
500.0000 mL | Freq: Once | INTRAVENOUS | Status: AC
  Administered 2013-03-29: 500 mL via INTRAVENOUS

## 2013-03-29 MED ORDER — KETOROLAC TROMETHAMINE 30 MG/ML IJ SOLN
30.0000 mg | Freq: Once | INTRAMUSCULAR | Status: AC
  Administered 2013-03-29: 30 mg via INTRAVENOUS
  Filled 2013-03-29: qty 1

## 2013-03-29 MED ORDER — DIPHENHYDRAMINE HCL 50 MG/ML IJ SOLN
25.0000 mg | Freq: Once | INTRAMUSCULAR | Status: AC
  Administered 2013-03-29: 25 mg via INTRAVENOUS
  Filled 2013-03-29: qty 1

## 2013-03-29 NOTE — ED Provider Notes (Signed)
CSN: [id number]     Arrival date & time 03/29/13  0901 History     First MD Initiated Contact with Patient 03/29/13 [phone]     Chief Complaint  Patient presents with  . Headache   (Consider location/radiation/quality/duration/timing/severity/associated sxs/prior Treatment) HPI Comments: Patient with a history of migraine headaches presents today with a chief complaint of headache.  She reports that the headache is located in the left frontal region and has been constant for the past 2 days.  Onset was gradual.  Pain does not radiate.  She reports that she has taken Vicodin, Zofran, and Maxalt for the headache without relief.  Yesterday she took Excedrin Migraine, which gave her mild relief.  She reports that the headache is associated with photophobia and nausea.  She denies fever, chills, neck pain, neck stiffness, or changes in vision.  She reports that her headache is similar to migraines that she has had in the past.  She has been seen by Neurology in the past for her frequent Migraines.  She reports that she is supposed to start receiving botox injections for her migraines in another week.  Patient is a 42 y.o. female presenting with headaches. The history is provided by the patient.  Headache Associated symptoms: nausea and photophobia   Associated symptoms: no blurred vision, no fever, no loss of balance, no near-syncope, no neck stiffness, no numbness, no paresthesias, no sinus pressure, no visual change and no vomiting     Past Medical History  Diagnosis Date  . Depression   . Migraines   . Allergy   . Anemia   . Anxiety    Past Surgical History  Procedure Laterality Date  . Tonsillectomy and adenoidectomy  1979  . Carpal tunnel release  1999  . Cervical cerclage  1991, 1996  . Tubal ligation    . Uterine ablation    . Appendectomy     Family History  Problem Relation Age of Onset  . Cancer Maternal Grandmother     lung  . Cancer Maternal Grandfather     prostate  .  Alcohol abuse Mother   . Cancer Paternal Grandmother     lung  . Stroke Paternal Grandfather    History  Substance Use Topics  . Smoking status: Never Smoker   . Smokeless tobacco: Never Used  . Alcohol Use: Yes     Comment: OCC   OB History   Grav Para Term Preterm Abortions TAB SAB Ect Mult Living   0              Review of Systems  Constitutional: Negative for fever.  HENT: Negative for neck stiffness and sinus pressure.   Eyes: Positive for photophobia. Negative for blurred vision.  Cardiovascular: Negative for near-syncope.  Gastrointestinal: Positive for nausea. Negative for vomiting.  Neurological: Positive for headaches. Negative for numbness, paresthesias and loss of balance.  All other systems reviewed and are negative.    Allergies  Clindamycin/lincomycin and Zithromax  Home Medications   Current Outpatient Rx  Name  Route  Sig  Dispense  Refill  . Aspirin-Salicylamide-Caffeine (BC HEADACHE POWDER PO)   Oral   Take 1 packet by mouth daily as needed. For headaches         . HYDROcodone-acetaminophen (NORCO/VICODIN) 5-325 MG per tablet   Oral   Take 1 tablet by mouth every 6 (six) hours as needed for pain.   20 tablet   0   . Iron-Vitamin C (VITRON-C) 65-125 MG TABS  Oral   Take by mouth.         . ondansetron (ZOFRAN) 4 MG tablet   Oral   Take 1 tablet (4 mg total) by mouth every 8 (eight) hours as needed. For nausea   20 tablet   0   . Phenylephrine-Chlorphen-DM 05-27-11.5 MG/5ML LIQD   Oral   Take 5 mLs by mouth every 4 (four) hours as needed. May cause drowsiness   120 mL   0   . rizatriptan (MAXALT) 10 MG tablet   Oral   Take 1 tablet (10 mg total) by mouth as needed for migraine. May repeat in 2 hours if needed   10 tablet   5    BP 116/81  Pulse 78  Temp(Src) 98 F (36.7 C) (Oral)  Resp 18  SpO2 96%  LMP 03/02/2013 Physical Exam  Nursing note and vitals reviewed. Constitutional: She appears well-developed and  well-nourished.  HENT:  Head: Normocephalic and atraumatic.  Mouth/Throat: Oropharynx is clear and moist.  Eyes: EOM are normal. Pupils are equal, round, and reactive to light.  Neck: Normal range of motion. Neck supple.  Cardiovascular: Normal rate, regular rhythm and normal heart sounds.   Pulmonary/Chest: Effort normal and breath sounds normal.  Neurological: She is alert. She has normal strength. No cranial nerve deficit or sensory deficit. She displays a negative Romberg sign. Coordination and gait normal.  Normal finger to nose testing bilaterally Normal rapid alternating movements  Skin: Skin is warm and dry.  Psychiatric: She has a normal mood and affect.    ED Course   Procedures (including critical care time)  Labs Reviewed - No data to display No results found. No diagnosis found.  10:31 AM Patient states that her headache has improved.    MDM  Pt HA treated and improved while in ED.  Presentation is like pts typical HA and non concerning for [facility], ICH, Meningitis, or temporal arteritis. Pt is afebrile with no focal neuro deficits, nuchal rigidity, or change in vision. Pt is to follow up with her Neurologist to discuss prophylactic medication. Pt verbalizes understanding and is agreeable with plan to dc.   Pascal Lux [place], PA-C 03/29/13 [phone]

## 2013-03-29 NOTE — ED Provider Notes (Signed)
  Medical screening examination/treatment/procedure(s) were performed by non-physician practitioner and as supervising physician I was immediately available for consultation/collaboration.    Gerhard Munch, MD 03/29/13 [phone]

## 2013-03-29 NOTE — ED Notes (Signed)
Woke up to h/a on Tuesday has had nausea took maxalt vicodin and zofran did not help pt drove herself here

## 2013-04-16 ENCOUNTER — Encounter: Payer: Self-pay | Admitting: Physical Medicine & Rehabilitation

## 2013-04-16 ENCOUNTER — Encounter: Payer: 59 | Attending: Physical Medicine & Rehabilitation

## 2013-04-16 ENCOUNTER — Ambulatory Visit: Payer: 59 | Admitting: Physical Medicine & Rehabilitation

## 2013-04-16 VITALS — BP 126/74 | HR 78 | Resp 14 | Ht 63.0 in | Wt 198.0 lb

## 2013-04-16 DIAGNOSIS — G43709 Chronic migraine without aura, not intractable, without status migrainosus: Secondary | ICD-10-CM | POA: Insufficient documentation

## 2013-04-16 DIAGNOSIS — G43019 Migraine without aura, intractable, without status migrainosus: Secondary | ICD-10-CM

## 2013-04-16 NOTE — Progress Notes (Signed)
Botox injection from talus, tempera L., suboccipital muscles, cervical paraspinal muscles as well as trapezius muscles Indication intractable migraine headaches, chronic Has had good relief from Botox in the past from another physician. Dilution 50 units per cc Informed consent was obtained after describing risks and benefits of procedure with patient is include bleeding bruising and infection she is asked to proceed and has given written consent She was placed in a seated position areas were marked on the frontalis area as well as procerus as well as corrugator 7 areas forehead 4 areas each temporalis 3 areas each suboccipital 2 areas each cervical paraspinal 3 areas each upper trap 5 units into each area Pt tolerated procedure well

## 2013-05-03 ENCOUNTER — Ambulatory Visit: Payer: Self-pay | Admitting: Pain Medicine

## 2013-05-15 ENCOUNTER — Ambulatory Visit: Payer: Self-pay | Admitting: Pain Medicine

## 2013-06-14 ENCOUNTER — Ambulatory Visit: Payer: Self-pay | Admitting: Family Medicine

## 2013-06-15 ENCOUNTER — Ambulatory Visit: Payer: 59 | Admitting: Physical Medicine & Rehabilitation

## 2013-07-15 ENCOUNTER — Other Ambulatory Visit: Payer: Self-pay | Admitting: Family Medicine

## 2013-07-16 NOTE — Telephone Encounter (Signed)
Needs OV.  

## 2013-07-18 ENCOUNTER — Other Ambulatory Visit (HOSPITAL_COMMUNITY)
Admission: RE | Admit: 2013-07-18 | Discharge: 2013-07-18 | Disposition: A | Discharge status: Home / Self Care | Payer: 59 | Source: Ambulatory Visit | Attending: Gynecology | Admitting: Gynecology

## 2013-07-18 ENCOUNTER — Encounter: Payer: Self-pay | Admitting: Gynecology

## 2013-07-18 ENCOUNTER — Ambulatory Visit (INDEPENDENT_AMBULATORY_CARE_PROVIDER_SITE_OTHER): Payer: 59 | Admitting: Gynecology

## 2013-07-18 VITALS — BP 120/80 | Ht 62.75 in | Wt 198.6 lb

## 2013-07-18 DIAGNOSIS — N946 Dysmenorrhea, unspecified: Secondary | ICD-10-CM

## 2013-07-18 DIAGNOSIS — N920 Excessive and frequent menstruation with regular cycle: Secondary | ICD-10-CM | POA: Insufficient documentation

## 2013-07-18 DIAGNOSIS — R87810 Cervical high risk human papillomavirus (HPV) DNA test positive: Secondary | ICD-10-CM | POA: Insufficient documentation

## 2013-07-18 DIAGNOSIS — Z01419 Encounter for gynecological examination (general) (routine) without abnormal findings: Secondary | ICD-10-CM

## 2013-07-18 DIAGNOSIS — D259 Leiomyoma of uterus, unspecified: Secondary | ICD-10-CM

## 2013-07-18 DIAGNOSIS — Z1151 Encounter for screening for human papillomavirus (HPV): Secondary | ICD-10-CM | POA: Insufficient documentation

## 2013-07-18 MED ORDER — TRANEXAMIC ACID 650 MG PO TABS: 1300.0000 mg | ORAL_TABLET | Freq: Three times a day (TID) | ORAL | Status: DC

## 2013-07-18 NOTE — Addendum Note (Signed)
Addended by: Richardson Chiquito on: 07/18/2013 05:15 PM   Modules accepted: Orders

## 2013-07-18 NOTE — Progress Notes (Signed)
Theresa Miranda 1971-07-16 [id number]   History:    42 y.o.  for annual gyn exam who approximately one and a half years ago underwent and endometrial ablation via her option technique here in the office. Patient states that there has been no change in her menstrual cycles from before the procedure and at times her cycle was lasted 3 weeks with lots of cramping and passage of large clots. Patient with known history of fibroid uterus. 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 Patient 2 prior normal spontaneous vaginal deliveries of 2 children delivered less than 7 pounds. Patient also and had tubal sterilization procedure after the birth of her last child.  Past medical history,surgical history, family history and social history were all reviewed and documented in the EPIC chart.  Gynecologic History Patient's last menstrual period was 06/17/2013. Contraception: tubal ligation Last Pap: See note above. Results were: see note above Last mammogram: no prior study. Results were: no prior study  Obstetric History OB History  Gravida Para Term Preterm AB SAB TAB Ectopic Multiple Living  0                  ROS: A ROS was performed and pertinent positives and negatives are included in the history.  GENERAL: No fevers or chills. HEENT: No change in vision, no earache, sore throat or sinus congestion. NECK: No pain or stiffness. CARDIOVASCULAR: No chest pain or pressure. No palpitations. PULMONARY: No shortness of breath, cough or wheeze. GASTROINTESTINAL: No abdominal pain, nausea, vomiting or diarrhea, melena or bright red blood per rectum. GENITOURINARY: No urinary frequency, urgency, hesitancy or dysuria. MUSCULOSKELETAL: No joint or muscle pain, no back pain, no recent trauma. DERMATOLOGIC: No  rash, no itching, no lesions. ENDOCRINE: No polyuria, polydipsia, no heat or cold intolerance. No recent change in weight. HEMATOLOGICAL: No anemia or easy bruising or bleeding. NEUROLOGIC: No headache, seizures, numbness, tingling or weakness. PSYCHIATRIC: No depression, no loss of interest in normal activity or change in sleep pattern.     Exam: chaperone present  BP 120/80  Ht 5' 2.75" (1.594 m)  Wt 198 lb 9.6 oz (90.084 kg)  BMI 35.45 kg/m2  LMP 06/17/2013  Body mass index is 35.45 kg/(m^2).  General appearance : Well developed well nourished female. No acute distress HEENT: Neck supple, trachea midline, no carotid bruits, no thyroidmegaly Lungs: Clear to auscultation, no rhonchi or wheezes, or rib retractions  Heart: Regular rate and rhythm, no murmurs or gallops Breast:Examined in sitting and supine position were symmetrical in appearance, no palpable masses or tenderness,  no skin retraction, no nipple inversion, no nipple discharge, no skin discoloration, no axillary or supraclavicular lymphadenopathy Abdomen: no palpable masses or tenderness, no rebound or guarding Extremities: no edema or skin discoloration or tenderness  Pelvic:  Bartholin, Urethra, Skene Glands: Within normal limits             Vagina: No gross lesions or discharge  Cervix: No gross lesions or discharge  Uterus  8-to 10 week size irregular Adnexa  Without masses or tenderness  Anus and perineum  normal   Rectovaginal  normal sphincter tone without palpated masses or tenderness             Hemoccult not done     Assessment/Plan:  42 y.o. female for  annual exam with 8-10 weeks size irregular shaped uterus with history of fibroid uterus. Patient had endometrial ablation via her option technique one and a half years ago complaining of worsening menorrhagia and dysmenorrhea. We discussed several options include the following: #1Lysteda 650 mg 2 tablets 3 times a day not to exceed 5 days during her menstrual  cycle #2 repeat endometrial ablation #3 low dose oral contraceptive pill #4 placement of Mirena IUD if intrauterine cavity will allow #5 laparoscopic-assisted vaginal hysterectomy with ovarian conservation  Patient will return back in one-to 2 weeks for a sonohysterogram. If the endometrial cavity will allow a Jearld Adjutant IUD to be placed we can schedule this at a later date the patient wishes to go this route. If she continues to have cervical dysplasia she would like to proceed with the laparoscopic-assisted vaginal hysterectomy with ovarian conservation. Pap smear was done today along with the following labs: CBC, screening cholesterol, TSH, urinalysis, and comprehensive metabolic panel. Patient recently had her flu vaccine. Patient was reminded also to schedule her mammogram. Literature and information on the above was provided.  Note: This dictation was prepared with  Dragon/digital dictation along withSmart phrase technology. Any transcriptional errors that result from this process are unintentional.   Ok Edwards MD, 3:40 PM 07/18/2013

## 2013-07-18 NOTE — Patient Instructions (Signed)
Hysterectomy Information  A hysterectomy is a procedure where your uterus is surgically removed. It will no longer be possible to have menstrual periods or to become pregnant. The tubes and ovaries can be removed (bilateral salpingo-oopherectomy) during this surgery as well.  REASONS FOR A HYSTERECTOMY  Persistent, abnormal bleeding.  Lasting (chronic) pelvic pain or infection.  The lining of the uterus (endometrium) starts growing outside the uterus (endometriosis).  The endometrium starts growing in the muscle of the uterus (adenomyosis).  The uterus falls down into the vagina (pelvic organ prolapse).  Symptomatic uterine fibroids.  Precancerous cells.  Cervical cancer or uterine cancer. TYPES OF HYSTERECTOMIES  Supracervical hysterectomy. This type removes the top part of the uterus, but not the cervix.  Total hysterectomy. This type removes the uterus and cervix.  Radical hysterectomy. This type removes the uterus, cervix, and the fibrous tissue that holds the uterus in place in the pelvis (parametrium). WAYS A HYSTERECTOMY CAN BE PERFORMED  Abdominal hysterectomy. A large surgical cut (incision) is made in the abdomen. The uterus is removed through this incision.  Vaginal hysterectomy. An incision is made in the vagina. The uterus is removed through this incision. There are no abdominal incisions.  Conventional laparoscopic hysterectomy. A thin, lighted tube with a camera (laparoscope) is inserted into 3 or 4 small incisions in the abdomen. The uterus is cut into small pieces. The small pieces are removed through the incisions, or they are removed through the vagina.  Laparoscopic assisted vaginal hysterectomy (LAVH). Three or four small incisions are made in the abdomen. Part of the surgery is performed laparoscopically and part vaginally. The uterus is removed through the vagina.  Robot-assisted laparoscopic hysterectomy. A laparoscope is inserted into 3 or 4 small  incisions in the abdomen. A computer-controlled device is used to give the surgeon a 3D image. This allows for more precise movements of surgical instruments. The uterus is cut into small pieces and removed through the incisions or removed through the vagina. RISKS OF HYSTERECTOMY   Bleeding and risk of blood transfusion. Tell your caregiver if you do not want to receive any blood products.  Blood clots in the legs or lung.  Infection.  Injury to surrounding organs.  Anesthesia problems or side effects.  Conversion to an abdominal hysterectomy. WHAT TO EXPECT AFTER A HYSTERECTOMY  You will be given pain medicine.  You will need to have someone with you for the first 3 to 5 days after you go home.  You will need to follow up with your surgeon in 2 to 4 weeks after surgery to evaluate your progress.  You may have early menopause symptoms like hot flashes, night sweats, and insomnia.  If you had a hysterectomy for a problem that was not a cancer or a condition that could lead to cancer, then you no longer need Pap tests. However, even if you no longer need a Pap test, a regular exam is a good idea to make sure no other problems are starting. Document Released: 02/02/2001 Document Revised: 11/01/2011 Document Reviewed: 03/20/2011 [facility] Patient Information 2014 [place], [place]. Tranexamic acid oral tablets What is this medicine? TRANEXAMIC ACID (TRAN ex AM ik AS id) slows down or stops blood clots from being broken down. This medicine is used to treat heavy monthly menstrual bleeding. This medicine may be used for other purposes; ask your health care provider or pharmacist if you have questions. COMMON BRAND NAME(S): Cyklokapron, Lysteda  What should I tell my health care provider before I  take this medicine? They need to know if you have any of these conditions: -bleeding in the brain -blood clotting problems -kidney disease -vision problems -an unusual allergic reaction to  tranexamic acid, other medicines, foods, dyes, or preservatives -pregnant or trying to get pregnant -breast-feeding How should I use this medicine? Take this medicine by mouth with a glass of water. Follow the directions on the prescription label. Do not cut, crush, or chew this medicine. You can take it with or without food. If it upsets your stomach, take it with food. Take your medicine at regular intervals. Do not take it more often than directed. Do not stop taking except on your doctor's advice. Do not take this medicine until your period has started. Do not take it for more than 5 days in a row. Do not take this medicine when you do not have your period. Talk to your pediatrician regarding the use of this medicine in children. While this drug may be prescribed for female children as young as 14 years of age for selected conditions, precautions do apply. Overdosage: If you think you've taken too much of this medicine contact a poison control center or emergency room at once. Overdosage: If you think you have taken too much of this medicine contact a poison control center or emergency room at once. NOTE: This medicine is only for you. Do not share this medicine with others. What if I miss a dose? If you miss a dose, take it when you remember, and then take your next dose at least 6 hours later. Do not take more than 2 tablets at a time to make up for missed doses. What may interact with this medicine? Do not take this medicine with any of the following medications: -female hormones, like estrogens or progestins and birth control pills, patches, rings, or injections  This medicine may also interact with the following medications: -certain medicines used to help your blood clot or break up blood clots -certain medicines used to treat leukemia This list may not describe all possible interactions. Give your health care provider a list of all the medicines, herbs, non-prescription drugs, or dietary  supplements you use. Also tell them if you smoke, drink alcohol, or use illegal drugs. Some items may interact with your medicine. What should I watch for while using this medicine? Tell your doctor or healthcare professional if your symptoms do not start to get better or if they get worse. Tell your doctor or healthcare professional if you notice any eye problems while taking this medicine. Your doctor will refer you to an eye doctor who will examine your eyes. What side effects may I notice from receiving this medicine? Side effects that you should report to your doctor or health care professional as soon as possible: -allergic reactions like skin rash, itching or hives, swelling of the face, lips, or tongue -breathing difficulties -changes in vision -sudden or severe pain in the chest, legs, head, or groin -unusually weak or tired  Side effects that usually do not require medical attention (Report these to your doctor or health care professional if they continue or are bothersome.): -back pain -headache -muscle or joint aches -sinus and nasal problems -stomach pain -tiredness This list may not describe all possible side effects. Call your doctor for medical advice about side effects. You may report side effects to FDA at 1-800-FDA-1088. Where should I keep my medicine? Keep out of the reach of children. Store at room temperature between 15 and 30  degrees C (59 and 86 degrees F). Throw away any unused medicine after the expiration date. NOTE: This sheet is a summary. It may not cover all possible information. If you have questions about this medicine, talk to your doctor, pharmacist, or health care provider.  2014, Elsevier/Gold Standard. (2012-07-24 17:45:19)

## 2013-07-24 ENCOUNTER — Other Ambulatory Visit: Payer: Self-pay | Admitting: Gynecology

## 2013-07-24 DIAGNOSIS — R3129 Other microscopic hematuria: Secondary | ICD-10-CM

## 2013-08-06 ENCOUNTER — Encounter: Payer: Self-pay | Admitting: Gynecology

## 2013-08-06 ENCOUNTER — Other Ambulatory Visit: Payer: 59

## 2013-08-06 ENCOUNTER — Other Ambulatory Visit: Payer: Self-pay | Admitting: *Deleted

## 2013-08-06 ENCOUNTER — Ambulatory Visit (INDEPENDENT_AMBULATORY_CARE_PROVIDER_SITE_OTHER): Payer: 59 | Admitting: Gynecology

## 2013-08-06 VITALS — BP 124/86

## 2013-08-06 DIAGNOSIS — E7800 Pure hypercholesterolemia, unspecified: Secondary | ICD-10-CM

## 2013-08-06 DIAGNOSIS — D069 Carcinoma in situ of cervix, unspecified: Secondary | ICD-10-CM | POA: Insufficient documentation

## 2013-08-06 NOTE — Progress Notes (Signed)
Patient presented to the office today for colposcopic evaluation as a result of her recent Pap smear which was abnormal. 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 RECOMMENDATION THERE ARE ATYPICAL GLANDS PRESENT WHICH MAY INDICATE POSSIBLE GLAND INVOLVEMENT WITH DYSPLASIA. CLINICAL CORRELATION IS RECOMMENDED.  Patient was counseled for cervical LEEP procedure.   Procedure:  LEEP (Leep electrosurgical excision procedure)    Patient Name:Theresa Miranda  Record [id number]  Indication For Surgery: CIN-2 and CIN-3  Surgeon: Reynaldo Minium H  Anesthesia: paracervical block with 1% lidocaine total 10 cc   Procedure:  LEEP (Loop electrosurgical excision procedure) Description of Operation:  After the patient was verbally counseled the patient was placed in the low lithotomy position.  A coated speculum was inserted into the vagina and colposcopic examination was performed with 4% acidic acid with findings noted above.  The cervix was then painted with Lugol's solution to delineate the margins of the lesion and transformation zone.  Approximately 10cc's of 1% xylocaine with epinephrine was infiltrated deep near the outer margin of the transformation zone circumferentially at 12, 3, 6, and 9 o'clock positions.  The [facility] Electrosurgical Generator was then turned on after the patient was grounded with pad electrode on her thigh and jewelry removed.  The settings on the generator were Blend 1 current 70 watts cut and 70 watts on the coagulation mode.  A size 20 x 12 loop electrode was utilized to exercise the atypical transformation zone.  The tip of the electrode was placed 3 mm from the edge of the lesion at 3, 6, 9 and 12  o'clock position.  The electrode was moved slowly over the lesion was within the loop limits.  A vaginal wall retractor was not used.  The loop was then repositioned and the finger switch on the hand held piece was activated ( or footpedal depressed).  A slight pressure on the shaft was applied and the loop was extended into the tissue up to its crossbar to a depth of 6mm, then with steady, slow motion across and underneath the endocervical button was excised.  The loop electrode was replaced with ball electrode set at 50 watts and the base of the crater was fulgurated circumferentially.  Monsell's paint was then applied for additional hemostasis.  A suture was placed at 12 o'clock position of cervical biopsy specimen for orientation, and placed in formation fixative for pathology evaluation.  Patient tolerated the procedure well with minimal blood loss and without any complications.  After the procedure patient left office with stable vital signs and instructions sheet.   [facility] HMD9:36 AMTD@  Note: This dictation was prepared with  Dragon/digital dictation along withSmart phrase technology. Any transcriptional errors that result from this process are unintentional.

## 2013-08-07 ENCOUNTER — Telehealth: Payer: Self-pay

## 2013-08-08 ENCOUNTER — Ambulatory Visit: Payer: 59 | Admitting: Gynecology

## 2013-08-08 ENCOUNTER — Other Ambulatory Visit: Payer: 59

## 2013-08-08 ENCOUNTER — Telehealth: Payer: Self-pay

## 2013-08-08 NOTE — Telephone Encounter (Signed)
When I called patient about her path result she told me that she is not feeling well at all to day.  She has appointment at 4pm today for [facility] and would like to reschedule it. I transferred her to appt desk to speak with Debarah Crape about r/s it.

## 2013-08-08 NOTE — Telephone Encounter (Signed)
error 

## 2013-08-09 ENCOUNTER — Other Ambulatory Visit: Payer: Self-pay | Admitting: Gynecology

## 2013-08-09 ENCOUNTER — Telehealth: Payer: Self-pay

## 2013-08-09 DIAGNOSIS — E7800 Pure hypercholesterolemia, unspecified: Secondary | ICD-10-CM

## 2013-08-09 NOTE — Telephone Encounter (Signed)
Question #1 correct response Question #2 would not recommend using tampon for the next 2 weeks to prevent aggravating the area was biopsy and adding 2 additional potential bleeding

## 2013-08-09 NOTE — Telephone Encounter (Signed)
Patient informed. 

## 2013-08-09 NOTE — Telephone Encounter (Signed)
Couple of questions:  1.  Patient had C&B and LEEP on Monday 08/06/13.  She is passing black clumps of discharge and was questioning what that was. I explained to her that Dr. Glenetta Hew used Monsels paint on her cervix to help with stopping any bleeding.  I told her that will shed and she might see little pieces of it for a week or so.  She is not having any other bleeding or pain. Feels fine.  OK?  2.  Her menses is due any time. She questioned will it be okay to use tampon now?

## 2013-08-13 ENCOUNTER — Telehealth: Payer: Self-pay

## 2013-08-13 ENCOUNTER — Encounter: Payer: Self-pay | Admitting: Gynecology

## 2013-08-13 ENCOUNTER — Ambulatory Visit (INDEPENDENT_AMBULATORY_CARE_PROVIDER_SITE_OTHER): Payer: 59 | Admitting: Gynecology

## 2013-08-13 VITALS — BP 126/78

## 2013-08-13 DIAGNOSIS — B9689 Other specified bacterial agents as the cause of diseases classified elsewhere: Secondary | ICD-10-CM

## 2013-08-13 DIAGNOSIS — N760 Acute vaginitis: Secondary | ICD-10-CM

## 2013-08-13 DIAGNOSIS — A499 Bacterial infection, unspecified: Secondary | ICD-10-CM

## 2013-08-13 DIAGNOSIS — M545 Low back pain, unspecified: Secondary | ICD-10-CM

## 2013-08-13 DIAGNOSIS — D069 Carcinoma in situ of cervix, unspecified: Secondary | ICD-10-CM

## 2013-08-13 DIAGNOSIS — N898 Other specified noninflammatory disorders of vagina: Secondary | ICD-10-CM

## 2013-08-13 DIAGNOSIS — Z09 Encounter for follow-up examination after completed treatment for conditions other than malignant neoplasm: Secondary | ICD-10-CM

## 2013-08-13 LAB — URINALYSIS W MICROSCOPIC + REFLEX CULTURE
Bilirubin Urine: NEGATIVE
Glucose, UA: NEGATIVE mg/dL
Leukocytes, UA: NEGATIVE
Protein, ur: NEGATIVE mg/dL
Specific Gravity, Urine: 1.025 (ref 1.005–1.030)
Urobilinogen, UA: 1 mg/dL (ref 0.0–1.0)

## 2013-08-13 LAB — WET PREP FOR TRICH, YEAST, CLUE: Trich, Wet Prep: NONE SEEN

## 2013-08-13 MED ORDER — METRONIDAZOLE 500 MG PO TABS: 500.0000 mg | ORAL_TABLET | Freq: Two times a day (BID) | ORAL | Status: DC

## 2013-08-13 NOTE — Progress Notes (Signed)
   Patient presents to the office today one week status post LEEP cervical conization for high-grade dysplasia complaining of a vaginal fascia odor discharge and low suprapubic discomfort low back discomfort. She denied any vaginal bleeding. No GU or GI complaints. Although at times when she's been straining she has noted some blood and tissue paper when constipated.  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  RECOMMENDATION  THERE ARE ATYPICAL GLANDS PRESENT WHICH MAY INDICATE POSSIBLE GLAND INVOLVEMENT  WITH DYSPLASIA. CLINICAL CORRELATION IS RECOMMENDED.  The patient underwent LEEP cervical conization here in the office 08/06/2013 with pathology report as follows:   Diagnosis 1. Cervix, LEEP - HIGH GRADE SQUAMOUS INTRAEPITHELIAL LESION, CIN-III (SEVERE DYSPLASIA/CIS). - BACKGROUND LOW GRADE SQUAMOUS INTRAEPITHELIAL LESION, CIN-I (MILD DYSPLASIA) INVOLVES THE ENDOCERVICAL AND ECTOCERVICAL MARGINS. - MARKED INFLAMMATION. 2. Cervix, LEEP, button - ENDOCERVICAL AND SQUAMOUS MUCOSA WITH MARKED INFLAMMATION. - NO DYSPLASIA OR MALIGNANCY  Exam: Abdomen: Soft nontender no rebound or guarding Back: No CVA tenderness more subjective and objective Bartholin urethra Skene was within normal limits Vagina: Fishy odor discharge noted Cervix: Cervical bed from cone biopsy healing charred-like material removed. No active bleeding. Bimanual exam: Slight suprapubic tenderness but no rebound or guarding no palpable masses noted.  Rectovaginal exam unremarkable  Urinalysis: Negative Wet prep moderate clue cell, few WBC, too numerous to count bacteria pos Amine.  Assessment/plan: Patient status post LEEP cervical conization for CIN-3 with  evidence of bacterial vaginosis. Patient will be placed on Flagyl 500 mg one by mouth twice a day for 7 days. She'll be started on Premarin vaginal cream to apply every other day for the next 2-3 weeks to help with the healing of the cervical bed. She'll return back to the office in 3 weeks a urine culture was obtained pending at time of this dictation.

## 2013-08-13 NOTE — Telephone Encounter (Signed)
Patient called c/o the black discharge persisting but now with "awful odor". She states she is still very uncomfortable. I called her back and recommended office visit today asap to let Dr. Glenetta Hew assess.

## 2013-08-14 LAB — URINE CULTURE
Colony Count: NO GROWTH
Organism ID, Bacteria: NO GROWTH

## 2013-08-27 ENCOUNTER — Ambulatory Visit (INDEPENDENT_AMBULATORY_CARE_PROVIDER_SITE_OTHER): Payer: 59 | Admitting: Physician Assistant

## 2013-08-27 ENCOUNTER — Ambulatory Visit: Payer: 59 | Admitting: Gynecology

## 2013-08-27 VITALS — BP 120/82 | HR 69 | Temp 98.6°F | Resp 18 | Ht 62.0 in | Wt 194.0 lb

## 2013-08-27 DIAGNOSIS — J329 Chronic sinusitis, unspecified: Secondary | ICD-10-CM

## 2013-08-27 DIAGNOSIS — R6889 Other general symptoms and signs: Secondary | ICD-10-CM

## 2013-08-27 MED ORDER — GUAIFENESIN ER 1200 MG PO TB12: 1.0000 | ORAL_TABLET | Freq: Two times a day (BID) | ORAL | Status: AC

## 2013-08-27 MED ORDER — IPRATROPIUM BROMIDE 0.06 % NA SOLN: 2.0000 | Freq: Three times a day (TID) | NASAL | Status: DC

## 2013-08-27 MED ORDER — HYDROCOD POLST-CHLORPHEN POLST 10-8 MG/5ML PO LQCR: 5.0000 mL | Freq: Two times a day (BID) | ORAL | Status: AC

## 2013-08-27 MED ORDER — AMOXICILLIN 875 MG PO TABS: 875.0000 mg | ORAL_TABLET | Freq: Two times a day (BID) | ORAL | Status: DC

## 2013-08-27 NOTE — Progress Notes (Signed)
   Subjective:    Patient ID: Theresa Miranda, female    DOB: [birth year], 43 y.o.   MRN: [id number]  HPI Pt presents to clinic wih 8 day h/o cold symptoms. She thought she was getting a little better 3 days ago but then over the last 2 days her symptoms have worsened.  Her nasal congestion is worse with now green rhinorrhea - her cough is dry and keeping her up at night.  She hurts all over and today she has a migraine she thinks for the lack of sleep and coughing.    OTC meds - Nyquil, Theraflu, mucinex DM (last couple of days) Sick contacts - brother last week Flu vaccine -Sept Review of Systems  Constitutional: Positive for fever (subjective) and chills.  HENT: Positive for congestion, postnasal drip and rhinorrhea (brown --> clear).   Respiratory: Positive for cough (dry).        No h/o asthma, non-smoker  Gastrointestinal: Negative for nausea, vomiting and diarrhea.  Musculoskeletal: Positive for myalgias.  Neurological: Positive for headaches.       Objective:   Physical Exam  Vitals reviewed. Constitutional: She is oriented to person, place, and time. She appears well-developed and well-nourished.  HENT:  Head: Normocephalic and atraumatic.  Right Ear: Hearing, external ear and ear canal normal. Tympanic membrane is retracted.  Left Ear: Hearing, external ear and ear canal normal. Tympanic membrane is bulging (serous fluid).  Nose: Mucosal edema (red) present.  Mouth/Throat: Oropharynx is clear and moist and mucous membranes are normal.  Eyes: Conjunctivae are normal.  Neck: Normal range of motion.  Cardiovascular: Normal rate, regular rhythm and normal heart sounds.   No murmur heard. Pulmonary/Chest: Effort normal and breath sounds normal. She has no wheezes.  Lymphadenopathy:    She has cervical adenopathy (AC enlarged and TTP).  Neurological: She is alert and oriented to person, place, and time.  Skin: Skin is warm and dry.  Psychiatric: She has a normal mood and  affect. Her behavior is normal. Judgment and thought content normal.        Assessment & Plan:  Sinus infection - Plan: amoxicillin (AMOXIL) 875 MG tablet  Flu-like symptoms - Plan: Guaifenesin (MUCINEX MAXIMUM STRENGTH) 1200 MG TB12, ipratropium (ATROVENT) 0.06 % nasal spray, chlorpheniramine-HYDROcodone (TUSSIONEX PENNKINETIC ER) 10-8 MG/5ML LQCR  Pt with suspected flu but at the end of the illness.  With her symptoms worsening over the last 2 days will treat for a sinus infection because her symptoms seem to be concentrated with her sinuses. Symptomatic treatment was discussed with the patient.  Windell Hummingbird PA-C 08/27/2013 9:17 PM

## 2013-08-27 NOTE — Patient Instructions (Signed)
Please push fluids.  Tylenol and Motrin for fever and body aches.    

## 2013-09-03 ENCOUNTER — Encounter: Payer: Self-pay | Admitting: Gynecology

## 2013-09-03 ENCOUNTER — Ambulatory Visit (INDEPENDENT_AMBULATORY_CARE_PROVIDER_SITE_OTHER): Payer: 59 | Admitting: Gynecology

## 2013-09-03 VITALS — BP 118/76

## 2013-09-03 DIAGNOSIS — D069 Carcinoma in situ of cervix, unspecified: Secondary | ICD-10-CM

## 2013-09-03 NOTE — Progress Notes (Signed)
   Patient presents to the office for her 4 week postop visit. Patient is status post LEEP cervical conization for high-grade dysplasia. 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  RECOMMENDATION  THERE ARE ATYPICAL GLANDS PRESENT WHICH MAY INDICATE POSSIBLE GLAND INVOLVEMENT  WITH DYSPLASIA. CLINICAL CORRELATION IS RECOMMENDED.  The patient underwent LEEP cervical conization here in the office 08/06/2013 with pathology report as follows:  Diagnosis  1. Cervix, LEEP  - HIGH GRADE SQUAMOUS INTRAEPITHELIAL LESION, CIN-III (SEVERE DYSPLASIA/CIS).  - BACKGROUND LOW GRADE SQUAMOUS INTRAEPITHELIAL LESION, CIN-I (MILD DYSPLASIA)  INVOLVES THE  ENDOCERVICAL AND ECTOCERVICAL MARGINS.  - MARKED INFLAMMATION.  2. Cervix, LEEP, button  - ENDOCERVICAL AND SQUAMOUS MUCOSA WITH MARKED INFLAMMATION.  - NO DYSPLASIA OR MALIGNANCY  1 week post procedure patient had bacterial vaginosis and was placed on Flagyl 500 mg twice a day for 7 days. She was then started on Premarin vaginal cream every other day for 2-3 weeks to help with the healing of the cervical bed after the cervical conization. She is doing well today  Exam: Bartholin urethra Skene was within normal limits Vagina: No lesions or discharge Cervix: Cervical bed from previous LEEP cervical conization 95% healed. Bimanual exam: Not done Rectal exam: Not done  Assessment/plan: The patient was CIN-3 status post LEEP cervical conization with positive ectocervical and endocervical margin was CIN-1 will continue to monitor closely. Patient will return back in 6 months for followup colposcopy and Pap smear. We discussed that if her Pap smears show any type of recurrent dysplasia that we will proceed with  hysterectomy. Patient also has had history of menorrhagia as follows:  8-10 weeks size irregular shaped uterus with history of fibroid uterus. Patient had endometrial ablation via her option technique one and a half years ago complaining of worsening menorrhagia and dysmenorrhea. We discussed several options include the following:  #1Lysteda 650 mg 2 tablets 3 times a day not to exceed 5 days during her menstrual cycle  #2 repeat endometrial ablation  #3 low dose oral contraceptive pill  #4 placement of Mirena IUD if intrauterine cavity will allow  #5 laparoscopic-assisted vaginal hysterectomy with ovarian conservation  Patient will return back to the office in 2 weeks for sonohysterogram. She'll return back in 6 months for colposcopy and Pap smear.

## 2013-09-05 ENCOUNTER — Ambulatory Visit (INDEPENDENT_AMBULATORY_CARE_PROVIDER_SITE_OTHER): Payer: 59 | Admitting: Gynecology

## 2013-09-05 ENCOUNTER — Other Ambulatory Visit: Payer: Self-pay | Admitting: Gynecology

## 2013-09-05 ENCOUNTER — Ambulatory Visit (INDEPENDENT_AMBULATORY_CARE_PROVIDER_SITE_OTHER): Payer: 59

## 2013-09-05 DIAGNOSIS — D259 Leiomyoma of uterus, unspecified: Secondary | ICD-10-CM

## 2013-09-05 DIAGNOSIS — N852 Hypertrophy of uterus: Secondary | ICD-10-CM

## 2013-09-05 DIAGNOSIS — D250 Submucous leiomyoma of uterus: Secondary | ICD-10-CM

## 2013-09-05 DIAGNOSIS — N920 Excessive and frequent menstruation with regular cycle: Secondary | ICD-10-CM

## 2013-09-05 DIAGNOSIS — D251 Intramural leiomyoma of uterus: Secondary | ICD-10-CM

## 2013-09-05 DIAGNOSIS — N946 Dysmenorrhea, unspecified: Secondary | ICD-10-CM

## 2013-09-05 DIAGNOSIS — D069 Carcinoma in situ of cervix, unspecified: Secondary | ICD-10-CM

## 2013-09-05 DIAGNOSIS — D252 Subserosal leiomyoma of uterus: Secondary | ICD-10-CM

## 2013-09-05 NOTE — Progress Notes (Signed)
   Patient presented to the office today for sonohysterogram. The patient with history of CIN-3 status post LEEP cervical conization with positive ectocervical and endocervical margin was CIN-1 and 2014. This was scheduled to return in 6 months for followup Pap smear and colposcopy and continuing close surveillance. Patient has an 8-10 week size irregular shaped uterus (fibroids) . Patient has had history of menorrhagia and had an endometrial ablation via her option technique one half years ago. She states her dysmenorrhea and menorrhagia are worsening. She had been prescribed Lysteda but she stated that it could decrease her bleeding only the first month with the second month things are still the same.  Her ultrasound/sono histogram today: Uterus measured 8.6 x 0.9 x 6.1 cm with endometrial stripe of 3.7 mm. Patient had several intramural and subserosal fibroids. Total 8 in quantity the largest one measuring 39 x 22 mm. Both ovaries were otherwise normal. After instilling normal saline into the uterine cavity a right submucosal fibroid encroaching 75% of the uterus and transmural with a measurement of 39 x 27 x 33 mm was noted.  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 Also with a recent history of CIN-3 with positive echo cervical and endocervical margin was CIN-1 it is recommended that we proceed with a recommended transvaginal hysterectomy with ovarian conservation. Patient was provided with ligature information and we'll schedule accordingly in the next few months and will see her for preop consultation 2 weeks before her surgery.

## 2013-09-05 NOTE — Patient Instructions (Signed)
Hysterectomy Information  A hysterectomy is a surgery in which your uterus is removed. This surgery may be done to treat various medical problems. After the surgery, you will no longer have menstrual periods. The surgery will also make you unable to become pregnant (sterile). The fallopian tubes and ovaries can be removed (bilateral salpingo-oophorectomy) during this surgery as well.  REASONS FOR A HYSTERECTOMY  Persistent, abnormal bleeding.  Lasting (chronic) pelvic pain or infection.  The lining of the uterus (endometrium) starts growing outside the uterus (endometriosis).  The endometrium starts growing in the muscle of the uterus (adenomyosis).  The uterus falls down into the vagina (pelvic organ prolapse).  Noncancerous growths in the uterus (uterine fibroids) that cause symptoms.  Precancerous cells.  Cervical cancer or uterine cancer. TYPES OF HYSTERECTOMIES  Supracervical hysterectomy In this type, the top part of the uterus is removed, but not the cervix.  Total hysterectomy The uterus and cervix are removed.  Radical hysterectomy The uterus, the cervix, and the fibrous tissue that holds the uterus in place in the pelvis (parametrium) are removed. WAYS A HYSTERECTOMY CAN BE PERFORMED  Abdominal hysterectomy A large surgical cut (incision) is made in the abdomen. The uterus is removed through this incision.  Vaginal hysterectomy An incision is made in the vagina. The uterus is removed through this incision. There are no abdominal incisions.  Conventional laparoscopic hysterectomy Three or four small incisions are made in the abdomen. A thin, lighted tube with a camera (laparoscope) is inserted into one of the incisions. Other tools are put through the other incisions. The uterus is cut into small pieces. The small pieces are removed through the incisions, or they are removed through the vagina.  Laparoscopically assisted vaginal hysterectomy (LAVH) Three or four small  incisions are made in the abdomen. Part of the surgery is performed laparoscopically and part vaginally. The uterus is removed through the vagina.  Robot-assisted laparoscopic hysterectomy A laparoscope and other tools are inserted into 3 or 4 small incisions in the abdomen. A computer-controlled device is used to give the surgeon a 3D image and to help control the surgical instruments. This allows for more precise movements of surgical instruments. The uterus is cut into small pieces and removed through the incisions or removed through the vagina. RISKS AND COMPLICATIONS  Possible complications associated with this procedure include:  Bleeding and risk of blood transfusion. Tell your health care provider if you do not want to receive any blood products.  Blood clots in the legs or lung.  Infection.  Injury to surrounding organs.  Problems or side effects related to anesthesia.  Conversion to an abdominal hysterectomy from one of the other techniques. WHAT TO EXPECT AFTER A HYSTERECTOMY  You will be given pain medicine.  You will need to have someone with you for the first 3 5 days after you go home.  You will need to follow up with your surgeon in 2 4 weeks after surgery to evaluate your progress.  You may have early menopause symptoms such as hot flashes, night sweats, and insomnia.  If you had a hysterectomy for a problem that was not cancer or not a condition that could lead to cancer, then you no longer need Pap tests. However, even if you no longer need a Pap test, a regular exam is a good idea to make sure no other problems are starting. Document Released: 02/02/2001 Document Revised: 05/30/2013 Document Reviewed: 04/16/2013 [facility] Patient Information 2014 [place]. Fibroids Fibroids are lumps (tumors)  that can occur any place in a woman's body. These lumps are not cancerous. Fibroids vary in size, weight, and where they grow. HOME CARE  Do not take aspirin.  Write  down the number of pads or tampons you use during your period. Tell your doctor. This can help determine the best treatment for you. GET HELP RIGHT AWAY IF:  You have pain in your lower belly (abdomen) that is not helped with medicine.  You have cramps that are not helped with medicine.  You have more bleeding between or during your period.  You feel lightheaded or pass out (faint).  Your lower belly pain gets worse. MAKE SURE YOU:  Understand these instructions.  Will watch your condition.  Will get help right away if you are not doing well or get worse. Document Released: 09/11/2010 Document Revised: 11/01/2011 Document Reviewed: 09/11/2010 [facility] Patient Information 2014 [place], [place].

## 2013-09-10 ENCOUNTER — Telehealth: Payer: Self-pay

## 2013-09-10 NOTE — Telephone Encounter (Signed)
I called patient because Dr. Moshe Salisbury had sent me an order for [facility] stating patient would like to try to schedule over the next several months. I reviewed her ins benefits with her and her estimated financial responsibility to [facility].  She asked that I send her a financial letter so that she will have the info at hand and she will need to consider and she will call me when she is ready to schedule.

## 2013-09-29 ENCOUNTER — Ambulatory Visit: Payer: 59

## 2013-09-29 ENCOUNTER — Ambulatory Visit (INDEPENDENT_AMBULATORY_CARE_PROVIDER_SITE_OTHER): Payer: 59 | Admitting: Family Medicine

## 2013-09-29 VITALS — BP 110/80 | HR 74 | Temp 98.8°F | Resp 17 | Ht 62.5 in | Wt 196.0 lb

## 2013-09-29 DIAGNOSIS — S6991XA Unspecified injury of right wrist, hand and finger(s), initial encounter: Secondary | ICD-10-CM

## 2013-09-29 DIAGNOSIS — M79644 Pain in right finger(s): Secondary | ICD-10-CM

## 2013-09-29 DIAGNOSIS — R110 Nausea: Secondary | ICD-10-CM

## 2013-09-29 DIAGNOSIS — S6990XA Unspecified injury of unspecified wrist, hand and finger(s), initial encounter: Secondary | ICD-10-CM

## 2013-09-29 DIAGNOSIS — S6980XA Other specified injuries of unspecified wrist, hand and finger(s), initial encounter: Secondary | ICD-10-CM

## 2013-09-29 DIAGNOSIS — M79609 Pain in unspecified limb: Secondary | ICD-10-CM

## 2013-09-29 DIAGNOSIS — G43909 Migraine, unspecified, not intractable, without status migrainosus: Secondary | ICD-10-CM

## 2013-09-29 MED ORDER — ONDANSETRON HCL 4 MG PO TABS: 4.0000 mg | ORAL_TABLET | Freq: Three times a day (TID) | ORAL | Status: DC | PRN

## 2013-09-29 MED ORDER — HYDROCODONE-ACETAMINOPHEN 5-325 MG PO TABS: 1.0000 | ORAL_TABLET | Freq: Four times a day (QID) | ORAL | Status: DC | PRN

## 2013-09-29 MED ORDER — PROMETHAZINE HCL 25 MG/ML IJ SOLN
25.0000 mg | Freq: Once | INTRAMUSCULAR | Status: AC
  Administered 2013-09-29: 25 mg via INTRAMUSCULAR

## 2013-09-29 NOTE — Progress Notes (Addendum)
This chart was scribed for [name], MD by Einar Pheasant, ED Scribe. This patient was seen in room 10 and the patient's care was started at 1:45 PM.  Authored by Janeann Forehand, MD. Unable to change in [facility].  Subjective:    Patient ID: Theresa Miranda, female    DOB: [birth year], 43 y.o.   MRN: [id number] Chief Complaint  Patient presents with   Migraine    Onset was on Thursday, usually take her maintenance medications but do not have any at home to take.    Finger Injury    Injured her right index finger last night, jammed it against the wall while trying to kill a bug. It is now swollen and very painful    HPI HPI Comments: Theresa Miranda is a 43 y.o. female who presents to [facility] complaining of a left sided migraine that started 2 days ago. Pt usually takes Maxolt and vicodin, which she did upon onset of her migraine but has since then run out of medication. She states that she tried to e-mail her PCP Dr. Rutherford Nail for a refill to her medication but he has not responded. She states that her LNMP started 4 days ago which propelled the onset of her migraine headache; which she says its a normal occurrence. Pt states that her last Vicodin prescription was written for her 1 month ago (about 60 pills). She reports taking a Vicodin everyday, due to her chronic headaches. She states that she has a follow up appointment with a neurologist next month. Pt is complaining of associated nausea and photophobia. She denies any dizziness, abdominal pain, or vomiting.  Pt is also complaining of an injury to her right index finger that occurred last night. Pt states that she was trying to kill a bug when she jammed her right index finger. She denies taking any OTC medication to manage the pain.   History of Botox injection by Elnita Maxwell 03/2013 for chronic migraine headaches.   PCP: Ashok Norris, MD    Review of Systems  Constitutional: Negative for fever and chills.    Eyes: Positive for photophobia.  Gastrointestinal: Positive for nausea. Negative for vomiting and abdominal pain.  Neurological: Positive for headaches. Negative for dizziness and light-headedness.       Objective:   Physical Exam  Nursing note and vitals reviewed. Constitutional: She is oriented to person, place, and time. She appears well-developed and well-nourished. No distress.  HENT:  Head: Normocephalic and atraumatic.  Eyes: EOM are normal. Pupils are equal, round, and reactive to light. Left eye exhibits no nystagmus.  Neck: Neck supple. No tracheal deviation present.  Cardiovascular: Normal rate.   Pulmonary/Chest: Effort normal. No respiratory distress.  Musculoskeletal: Normal range of motion.  Diffusely tender at right 2nd phalanx from MCP to DIP. Tender at Dorsal hand at the end to second metacarpal at MCP. She's guarded but has some extension and flexion at DIP and PIP. Skin intact no ecchymosis. Slight soft tissue swelling of mid second phalanx.   Neurological: She is alert and oriented to person, place, and time.  Negative Romberg's. Negative Pronator Drift. Negative heel to toe. No focal weakness.   Skin: Skin is warm and dry.  Psychiatric: She has a normal mood and affect. Her behavior is normal.     Filed Vitals:   09/29/13 1310  BP: 110/80  Pulse: 74  Temp: 98.8 F (37.1 C)  TempSrc: Oral  Resp: 17  Height: 5' 2.5" (1.588 m)  Weight: 196 lb (88.905 kg)  SpO2: 97%    [facility] reading (PRIMARY) by  Dr. Carlota Raspberry: R 2nd phalynx - negative.       Assessment & Plan:   Theresa Miranda is a 43 y.o. female Pain in finger of right hand - Plan: DG Finger Index Right, Jammed interphalangeal joint of finger of right hand - guarded exam, but no obvious tendon weakness.  No apparent fx. Foldover splint applied, advil if needed. Recheck in next 5 days.    Migraine - Plan: promethazine (PHENERGAN) injection 25 mg, HYDROcodone-acetaminophen (NORCO/VICODIN) 5-325 MG  per tablet, ondansetron (ZOFRAN) 4 MG tablet, HYDROcodone-acetaminophen (NORCO/VICODIN) 5-325 MG per tablet - typical migraine/menstrual migraine. nonfocal exam. in good faith, refilled hydrocodone temporarily until can call primary provider's office Monday am. Phenergan given in office, rx zofran., and ER/911 precuations if any worsening.   Nausea alone - Plan: ondansetron (ZOFRAN) 4 MG tablet rx.   Meds ordered this encounter  Medications   promethazine (PHENERGAN) injection 25 mg    Sig:    HYDROcodone-acetaminophen (NORCO/VICODIN) 5-325 MG per tablet    Sig: Take 1 tablet by mouth every 6 (six) hours as needed.    Dispense:  15 tablet    Refill:  0   ondansetron (ZOFRAN) 4 MG tablet    Sig: Take 1 tablet (4 mg total) by mouth every 8 (eight) hours as needed. For nausea    Dispense:  20 tablet    Refill:  0   HYDROcodone-acetaminophen (NORCO/VICODIN) 5-325 MG per tablet    Sig: Take 1 tablet by mouth every 6 (six) hours as needed.    Dispense:  20 tablet    Refill:  0   Patient Instructions  Phenergan 25mg  given in the office. Hydrocodone prescribed, then zofran if needed at home. Call your primary care provider Monday morning to discuss medications. Return to the clinic or go to the nearest emergency room if any of your symptoms worsen or new symptoms occur.  recheck here or primary provider in next 5 days for your jammed finger to make sure tendons are working ok.  Can wear brace until then. advil over the counter if needed.      I personally performed the services described in this documentation, which was scribed in my presence. The recorded information has been reviewed and considered, and addended by me as needed.

## 2013-09-29 NOTE — Patient Instructions (Signed)
Phenergan 25mg  given in the office. Hydrocodone prescribed, then zofran if needed at home. Call your primary care provider Monday morning to discuss medications. Return to the clinic or go to the nearest emergency room if any of your symptoms worsen or new symptoms occur.  recheck here or primary provider in next 5 days for your jammed finger to make sure tendons are working ok.  Can wear brace until then. advil over the counter if needed.

## 2013-10-16 ENCOUNTER — Telehealth: Payer: Self-pay

## 2013-10-16 NOTE — Telephone Encounter (Signed)
Patient wanted to let you know she wants to proceed with "partial hysterectomy".

## 2013-10-16 NOTE — Telephone Encounter (Signed)
error 

## 2013-10-16 NOTE — Telephone Encounter (Signed)
Please schedule TAH NOT TVH. Will need preop consult 1`-2 weeks prior to surgery. Schedule for March to allow three months for cervix to heal from LEEP [facility] done in Dec.

## 2013-10-17 ENCOUNTER — Telehealth: Payer: Self-pay

## 2013-10-17 NOTE — Telephone Encounter (Signed)
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 This was the reason for the conization. The scaring after conization makes it difficult to dissect tissue planes vaginally and this is the reason why we are doing it abdominally.

## 2013-10-17 NOTE — Telephone Encounter (Signed)
1.  Patient had called about scheduling hysterectomy. You told me to schedule TAH not TVH. Patient said at office visit you discussed vaginal hysterectomy and she wonders why this plan has changed.  2.  Also, she is concerned about the abnormal cervical cells she has. She wants to know "how close they are to being cancer?

## 2013-10-19 NOTE — Telephone Encounter (Signed)
Relayed this info to patient. Surgery scheduled for 11/27/13 1:00pm at [facility]. Transferred to appt desk to schedule consult with Dr. Moshe Salisbury. We discussed her ins benefits and financial obligation.

## 2013-11-01 ENCOUNTER — Ambulatory Visit (INDEPENDENT_AMBULATORY_CARE_PROVIDER_SITE_OTHER): Payer: 59 | Admitting: Neurology

## 2013-11-01 ENCOUNTER — Encounter: Payer: Self-pay | Admitting: Neurology

## 2013-11-01 VITALS — BP 126/78 | HR 80 | Temp 98.2°F | Resp 16 | Ht 62.0 in | Wt 197.7 lb

## 2013-11-01 DIAGNOSIS — G43719 Chronic migraine without aura, intractable, without status migrainosus: Secondary | ICD-10-CM

## 2013-11-01 MED ORDER — NORTRIPTYLINE HCL 25 MG PO CAPS: 25.0000 mg | ORAL_CAPSULE | Freq: Every day | ORAL | Status: DC

## 2013-11-01 NOTE — Patient Instructions (Signed)
1.  We will start nortriptyline 25mg  at bedtime.  Side effects include sleepiness, dizziness, dry mouth and weight gain.  Call in 3 weeks with update. 2.  For abortive medication, take the Maxalt at earliest onset of headache.  May take with naproxen to see if more effective.  May take with zofran.  Do not take Vicodin.  Limit pain relievers to no more than 2 days out of the week. 3.  Follow up in 3 months.  Call in 3 weeks with update.

## 2013-11-01 NOTE — Progress Notes (Signed)
NEUROLOGY CONSULTATION NOTE  Theresa Miranda MRN: [id number] DOB: 1971/06/03  Referring provider: Dr. Rutherford Nail Primary care provider: Dr. Rutherford Nail  Reason for consult:  Headache  HISTORY OF PRESENT ILLNESS: Theresa Miranda is a 43 year old right-handed woman with history of cervical intraepithelial neoplasia, dysmenorrhea, menorrhagia and fibroid uterus who presents for headache.  Records and images were personally reviewed where available.  Onset:  Since teenager.  Worse over past 3 years. Location:  Variable.  Bilateral or unilateral (worse when on the left). Quality:  Pounding, pressure Intensity:  10/10 Aura:  no Associated symptoms:  Nausea, photophobia, phonophobia, osmophobia, sometimes vomiting.  No visual disturbance. Duration:  Up to 2 days Frequency:  2-3x/week Triggers/exacerbating factors:  Poor sleep, red wine, alcohol sometimes, around menstrual cycle, smells, change in weather Relieving factors:  Lay down, medication Activity:  Lay down when severe  Also has chronic daily headache, which is pressure-like, bifrontal and 5/10 intensity.  Able to function.  Past abortive therapy:  BC powder (effective), Aleve (helpful but bothers stomach) Past preventative therapy:  Propranolol 80mg  (for one month, side effects-felt funny), topamax (for 3-4 months, stutter and cognitive clouding, ineffective), Botox (had 2 rounds with one doctor which helped intensity but not frequency, had one round with another doctor and was ineffective).  Current abortive therapy:  Maxalt 10mg /Zofran 8mg /Vicodin (1x/week, helpful), Excedrin (2x/week, sometimes helpful), BC powder (once every 2 weeks, helpful) Current preventative therapy:  none  Caffeine:  1 soda every other day Stress/depression:  A lot of stress and depression related to work, family and health (has CIN 3 and scheduled for hysterectomy next week). Sleep hygiene:  Poor.  Cannot fall asleep.  Sleeps 2.5 to 3 hours. Fatigued.   Started on Ativan last week (not helpful). Family history of headache:  Father, paternal grandmother, paternal aunt  PAST MEDICAL HISTORY: Past Medical History  Diagnosis Date  . Depression   . Migraines   . Allergy   . Anemia   . Anxiety     PAST SURGICAL HISTORY: Past Surgical History  Procedure Laterality Date  . Tonsillectomy and adenoidectomy  1979  . Carpal tunnel release  1999  . Cervical cerclage  1991, 1996  . Tubal ligation    . Uterine ablation    . Appendectomy      MEDICATIONS: Current Outpatient Prescriptions on File Prior to Visit  Medication Sig Dispense Refill  . amoxicillin (AMOXIL) 875 MG tablet Take 1 tablet (875 mg total) by mouth 2 (two) times daily.  20 tablet  0  . Aspirin-Salicylamide-Caffeine (BC HEADACHE POWDER PO) Take 1 packet by mouth daily as needed. For headaches      . HYDROcodone-acetaminophen (NORCO/VICODIN) 5-325 MG per tablet Take 1 tablet by mouth every 6 (six) hours as needed.  15 tablet  0  . HYDROcodone-acetaminophen (NORCO/VICODIN) 5-325 MG per tablet Take 1 tablet by mouth every 6 (six) hours as needed.  20 tablet  0  . ipratropium (ATROVENT) 0.06 % nasal spray Place 2 sprays into the nose 3 (three) times daily.  15 mL  0  . ondansetron (ZOFRAN) 4 MG tablet Take 1 tablet (4 mg total) by mouth every 8 (eight) hours as needed. For nausea  20 tablet  0  . rizatriptan (MAXALT) 10 MG tablet Take 1 tablet (10 mg total) by mouth as needed for migraine. Repeat in 2 hrs if needed.  NEED VISIT!  10 tablet  0  . tranexamic acid (LYSTEDA) 650 MG TABS tablet Take 2 tablets (  1,300 mg total) by mouth 3 (three) times daily.  30 tablet  11  . Iron-Vitamin C (VITRON-C) 65-125 MG TABS Take 1 tablet by mouth daily.        Current Facility-Administered Medications on File Prior to Visit  Medication Dose Route Frequency Provider Last Rate Last Dose  . lidocaine (XYLOCAINE) 1 % injection 12 mL  12 mL Infiltration Once Terrance Mass, MD         ALLERGIES: Allergies  Allergen Reactions  . Clindamycin/Lincomycin Anaphylaxis    Pt has "anaphylaxis" to Clindamycin per [facility] pharmacist.  . Azucena Fallen [Azithromycin] Itching    FAMILY HISTORY: Family History  Problem Relation Age of Onset  . Cancer Maternal Grandmother     lung  . Cancer Maternal Grandfather     prostate  . Alcohol abuse Mother   . Cancer Paternal Grandmother     lung  . Stroke Paternal Grandfather     SOCIAL HISTORY: History   Social History  . Marital Status: Widowed    Spouse Name: N/A    Number of Children: N/A  . Years of Education: N/A   Occupational History  . [employer] [employer] History Main Topics  . Smoking status: Never Smoker   . Smokeless tobacco: Never Used  . Alcohol Use: Yes     Comment: OCC  . Drug Use: No  . Sexual Activity: Yes    Partners: Male    Birth Control/ Protection: Surgical     Comment: TUBAL LIGATION   Other Topics Concern  . Not on file   Social History Narrative  . No narrative on file    REVIEW OF SYSTEMS: Constitutional: No fevers, chills, or sweats, no generalized fatigue, change in appetite Eyes: No visual changes, double vision, eye pain Ear, nose and throat: No hearing loss, ear pain, nasal congestion, sore throat Cardiovascular: No chest pain, palpitations Respiratory:  No shortness of breath at rest or with exertion, wheezes GastrointestinaI: No nausea, vomiting, diarrhea, abdominal pain, fecal incontinence Genitourinary:  No dysuria, urinary retention or frequency Musculoskeletal:  No neck pain, back pain Integumentary: No rash, pruritus, skin lesions Neurological: as above Psychiatric: No depression, insomnia, anxiety Endocrine: No palpitations, fatigue, diaphoresis, mood swings, change in appetite, change in weight, increased thirst Hematologic/Lymphatic:  No anemia, purpura, petechiae. Allergic/Immunologic: no itchy/runny eyes, nasal congestion, recent  allergic reactions, rashes  PHYSICAL EXAM: Filed Vitals:   11/01/13 0854  BP: 126/78  Pulse: 80  Temp: 98.2 F (36.8 C)  Resp: 16   General: No acute distress Head:  Normocephalic/atraumatic Neck: supple, no paraspinal tenderness, full range of motion Back: No paraspinal tenderness Heart: regular rate and rhythm Lungs: Clear to auscultation bilaterally. Vascular: No carotid bruits. Neurological Exam: Mental status: alert and oriented to person, place, and time, speech fluent and not dysarthric, language intact. Cranial nerves: CN I: not tested CN II: pupils equal, round and reactive to light, visual fields intact, fundi unremarkable. CN III, IV, VI:  full range of motion, no nystagmus, no ptosis CN V: facial sensation intact CN VII: upper and lower face symmetric CN VIII: hearing intact CN IX, X: gag intact, uvula midline CN XI: sternocleidomastoid and trapezius muscles intact CN XII: tongue midline Bulk & Tone: normal, no fasciculations. Motor: 5/5 throughout Sensation: temperature and vibration intact Deep Tendon Reflexes: 2+ throughout, toes down Finger to nose testing: no dysmetria Heel to shin: no dysmetria Gait: normal stance and stride.  Able to turn and walk in  tandem. Romberg negative.  IMPRESSION: Chronic migraine Chronic daily headaches  PLAN: 1.  Will try nortriptyline 25mg  at bedtime.  Side effects such as sleepiness, dizziness, dry mouth and weight gain discussed. 2.  Continue Maxalt and Zofran but stop Vicodin.  Limit pain relievers to no more than 2 days out of the week. 3.  Follow up in 3 months.  Call in 3 weeks with update. NOTE:  She did have some positive response to Botox after 2 rounds, so this is still a possible future treatment.  Thank you for allowing me to take part in the care of this patient.  Metta Clines, DO  CC:  Ashok Norris, MD

## 2013-11-15 ENCOUNTER — Encounter (HOSPITAL_COMMUNITY): Payer: Self-pay | Admitting: Pharmacist

## 2013-11-16 NOTE — Patient Instructions (Addendum)
Your procedure is scheduled on: Tuesday, November 27, 2013  Enter through the [employer] [facility] at: 11:30 am  Pick up the phone at the desk and dial [phone].  Call this number if you have problems the morning of surgery: [phone].  Remember: Do NOT eat food:  After midnight Monday Do NOT drink clear liquids after: After midnight Monday Take these medicines the morning of surgery with a SIP OF WATER: Ativan if needed Do NOT wear jewelry (body piercing), metal hair clips/bobby pins, make-up, or nail polish. Do NOT wear lotions, powders, or perfumes.  You may wear deoderant. Do NOT shave for 48 hours prior to surgery. Do NOT bring valuables to the hospital. Contacts, dentures, or bridgework may not be worn into surgery. Leave suitcase in car.  After surgery it may be brought to your room.  For patients admitted to the hospital, checkout time is 11:00 AM the day of discharge.

## 2013-11-19 ENCOUNTER — Ambulatory Visit (INDEPENDENT_AMBULATORY_CARE_PROVIDER_SITE_OTHER): Payer: 59 | Admitting: Gynecology

## 2013-11-19 ENCOUNTER — Encounter (HOSPITAL_COMMUNITY)
Admission: RE | Admit: 2013-11-19 | Discharge: 2013-11-19 | Disposition: A | Discharge status: Home / Self Care | Payer: 59 | Source: Ambulatory Visit | Attending: Gynecology | Admitting: Gynecology

## 2013-11-19 ENCOUNTER — Encounter: Payer: Self-pay | Admitting: Gynecology

## 2013-11-19 ENCOUNTER — Encounter (HOSPITAL_COMMUNITY): Payer: Self-pay

## 2013-11-19 VITALS — BP 122/78

## 2013-11-19 DIAGNOSIS — N949 Unspecified condition associated with female genital organs and menstrual cycle: Secondary | ICD-10-CM

## 2013-11-19 DIAGNOSIS — N83201 Unspecified ovarian cyst, right side: Secondary | ICD-10-CM | POA: Insufficient documentation

## 2013-11-19 DIAGNOSIS — N898 Other specified noninflammatory disorders of vagina: Secondary | ICD-10-CM

## 2013-11-19 DIAGNOSIS — Z01818 Encounter for other preprocedural examination: Secondary | ICD-10-CM

## 2013-11-19 DIAGNOSIS — N920 Excessive and frequent menstruation with regular cycle: Secondary | ICD-10-CM

## 2013-11-19 DIAGNOSIS — Z87410 Personal history of cervical dysplasia: Secondary | ICD-10-CM

## 2013-11-19 DIAGNOSIS — N946 Dysmenorrhea, unspecified: Secondary | ICD-10-CM

## 2013-11-19 DIAGNOSIS — D259 Leiomyoma of uterus, unspecified: Secondary | ICD-10-CM

## 2013-11-19 DIAGNOSIS — Z01812 Encounter for preprocedural laboratory examination: Secondary | ICD-10-CM | POA: Insufficient documentation

## 2013-11-19 DIAGNOSIS — R102 Pelvic and perineal pain: Secondary | ICD-10-CM

## 2013-11-19 HISTORY — DX: Gastro-esophageal reflux disease without esophagitis: K21.9

## 2013-11-19 HISTORY — DX: Malignant (primary) neoplasm, unspecified: C80.1

## 2013-11-19 LAB — URINALYSIS, ROUTINE W REFLEX MICROSCOPIC
BILIRUBIN URINE: NEGATIVE
GLUCOSE, UA: NEGATIVE mg/dL
KETONES UR: NEGATIVE mg/dL
LEUKOCYTES UA: NEGATIVE
Nitrite: NEGATIVE
PROTEIN: NEGATIVE mg/dL
Specific Gravity, Urine: >1.03 — ABNORMAL HIGH (ref 1.005–1.030)
Urobilinogen, UA: 0.2 mg/dL (ref 0.0–1.0)
pH: 5.5 (ref 5.0–8.0)

## 2013-11-19 LAB — CBC
HEMATOCRIT: 36.8 % (ref 36.0–46.0)
Hemoglobin: 12.1 g/dL (ref 12.0–15.0)
MCH: 29.4 pg (ref 26.0–34.0)
MCHC: 32.9 g/dL (ref 30.0–36.0)
MCV: 89.3 fL (ref 78.0–100.0)
Platelets: 240 10*3/uL (ref 150–400)
RBC: 4.12 MIL/uL (ref 3.87–5.11)
RDW: 13.1 % (ref 11.5–15.5)
WBC: 5.6 10*3/uL (ref 4.0–10.5)

## 2013-11-19 LAB — URINE MICROSCOPIC-ADD ON

## 2013-11-19 LAB — WET PREP FOR TRICH, YEAST, CLUE
TRICH WET PREP: NONE SEEN
WBC, Wet Prep HPF POC: NONE SEEN
YEAST WET PREP: NONE SEEN

## 2013-11-19 MED ORDER — METRONIDAZOLE 0.75 % VA GEL: 1.0000 | Freq: Two times a day (BID) | VAGINAL | Status: DC

## 2013-11-19 MED ORDER — METOCLOPRAMIDE HCL 10 MG PO TABS: 10.0000 mg | ORAL_TABLET | Freq: Three times a day (TID) | ORAL | Status: DC

## 2013-11-19 MED ORDER — HYDROCODONE-ACETAMINOPHEN 7.5-325 MG PO TABS: 1.0000 | ORAL_TABLET | Freq: Four times a day (QID) | ORAL | Status: DC | PRN

## 2013-11-19 NOTE — Patient Instructions (Addendum)

## 2013-11-19 NOTE — Progress Notes (Signed)
 Theresa Miranda is an 42 y.o. female. For preoperative examination. The patient scheduled for total abdominal hysterectomy with ovarian conservation next week.  The patient with history of CIN-3 status post LEEP cervical conization with positive ectocervical and endocervical margin was CIN-1 and 2014. This was scheduled to return in 6 months for followup Pap smear and colposcopy and continuing close surveillance. Patient has an 8-10 week size irregular shaped uterus (fibroids) . Patient has had history of menorrhagia and had an endometrial ablation via her option technique one half years ago. She states her dysmenorrhea and menorrhagia are worsening. She had been prescribed Lysteda but she stated that it could decrease her bleeding only the first month with the second month things are still the same.  Patient had a sonohysterogram on Jerry 14 2015 with the following findings  Uterus measured 8.6 x 0.9 x 6.1 cm with endometrial stripe of 3.7 mm. Patient had several intramural and subserosal fibroids. Total 8 in quantity the largest one measuring 39 x 22 mm. Both ovaries were otherwise normal. After instilling normal saline into the uterine cavity a right submucosal fibroid encroaching 75% of the uterus and transmural with a measurement of 39 x 27 x 33 mm was noted.   The patient was having a slight vaginal discharge with odor and a wet prep today demonstrated clinical evidence of bacterial vaginosis  Pertinent Gynecological History: Menses: Laring 7 days and heavy Bleeding: intermenstrual bleeding Contraception: tubal ligation DES exposure: denies Blood transfusions: none Sexually transmitted diseases: no past history Previous GYN Procedures: L:EEP [facility], BTL  Last mammogram: no prior study  Last pap: 2014 High grade dysplasia Date: 2014 OB History: G2, P2   Menstrual History: Menarche age 11 Patient's last menstrual period was 11/05/2013.    Past Medical History  Diagnosis Date  .  Depression   . Migraines   . Allergy   . Anemia   . Anxiety   . GERD (gastroesophageal reflux disease)     occ.  . Cancer     stage 0 cervical    Past Surgical History  Procedure Laterality Date  . Tonsillectomy and adenoidectomy  1979  . Carpal tunnel release  1999  . Cervical cerclage  1991, 1996  . Tubal ligation    . Uterine ablation    . Appendectomy    . Wrist arthroscopy Bilateral     Family History  Problem Relation Age of Onset  . Cancer Maternal Grandmother     lung  . Cancer Maternal Grandfather     prostate  . Alcohol abuse Mother   . Cancer Paternal Grandmother     lung  . Stroke Paternal Grandfather     Social History:  reports that she has never smoked. She has never used smokeless tobacco. She reports that she drinks alcohol. She reports that she does not use illicit drugs.  Allergies:  Allergies  Allergen Reactions  . Clindamycin/Lincomycin Anaphylaxis    Pt has "anaphylaxis" to Clindamycin per [facility] pharmacist.  . Zithromax [Azithromycin] Itching     (Not in a hospital admission)  REVIEW OF SYSTEMS: A ROS was performed and pertinent positives and negatives are included in the history.  GENERAL: No fevers or chills. HEENT: No change in vision, no earache, sore throat or sinus congestion. NECK: No pain or stiffness. CARDIOVASCULAR: No chest pain or pressure. No palpitations. PULMONARY: No shortness of breath, cough or wheeze. GASTROINTESTINAL: No abdominal pain, nausea, vomiting or diarrhea, melena or bright red blood per   rectum. GENITOURINARY: No urinary frequency, urgency, hesitancy or dysuria. MUSCULOSKELETAL: No joint or muscle pain, no back pain, no recent trauma. DERMATOLOGIC: No rash, no itching, no lesions. ENDOCRINE: No polyuria, polydipsia, no heat or cold intolerance. No recent change in weight. HEMATOLOGICAL: No anemia or easy bruising or bleeding. NEUROLOGIC: No headache, seizures, numbness, tingling or weakness. PSYCHIATRIC: No  depression, no loss of interest in normal activity or change in sleep pattern.     Blood pressure 122/78, last menstrual period 11/05/2013.  Physical Exam:  HEENT:unremarkable Neck:Supple, midline, no thyroid megaly, no carotid bruits Lungs:  Clear to auscultation no rhonchi's or wheezes Heart:Regular rate and rhythm, no murmurs or gallops Breast Exam: Not done today Abdomen: Soft nontender no rebound or guarding small subumbilical scar noted from previous tubal ligation Pelvic:BUS within normal limits Vagina: Clear discharge with fish like odor Cervix: No lesions or discharge Uterus: 10-12 week size irregular Adnexa: No masses or tenderness Extremities: No cords, no edema Rectal: Not done  Results for orders placed during the hospital encounter of 11/19/13 (from the past 24 hour(s))  URINALYSIS, ROUTINE W REFLEX MICROSCOPIC     Status: Abnormal   Collection Time    11/19/13  2:28 PM      Result Value Ref Range   Color, Urine YELLOW  YELLOW   APPearance CLEAR  CLEAR   Specific Gravity, Urine >1.030 (*) 1.005 - 1.030   pH 5.5  5.0 - 8.0   Glucose, UA NEGATIVE  NEGATIVE mg/dL   Hgb urine dipstick LARGE (*) NEGATIVE   Bilirubin Urine NEGATIVE  NEGATIVE   Ketones, ur NEGATIVE  NEGATIVE mg/dL   Protein, ur NEGATIVE  NEGATIVE mg/dL   Urobilinogen, UA 0.2  0.0 - 1.0 mg/dL   Nitrite NEGATIVE  NEGATIVE   Leukocytes, UA NEGATIVE  NEGATIVE  URINE MICROSCOPIC-ADD ON     Status: Abnormal   Collection Time    11/19/13  2:28 PM      Result Value Ref Range   Squamous Epithelial / LPF FEW (*) RARE   WBC, UA 0-2  <3 WBC/hpf   RBC / HPF 0-2  <3 RBC/hpf   Bacteria, UA RARE  RARE  CBC     Status: None   Collection Time    11/19/13  2:30 PM      Result Value Ref Range   WBC 5.6  4.0 - 10.5 K/uL   RBC 4.12  3.87 - 5.11 MIL/uL   Hemoglobin 12.1  12.0 - 15.0 g/dL   HCT 36.8  36.0 - 46.0 %   MCV 89.3  78.0 - 100.0 fL   MCH 29.4  26.0 - 34.0 pg   MCHC 32.9  30.0 - 36.0 g/dL   RDW 13.1   11.5 - 15.5 %   Platelets 240  150 - 400 K/uL    Assessment/Plan: Patient scheduled to undergo total abdominal hysterectomy with ovarian conservation and bilateral salpingectomy next week as a result of worsening dysmenorrhea, menorrhagia, and 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 Patient also with history of CIN-3 status post LEEP cervical conization with positive ectocervical and endocervical margins with CIN-1 in 2014. We're going to treat her bacterial vaginosis today with MetroGel to apply intravaginally twice a week for 7 days. The following risk for surgery were discussed                        Patient was counseled as to the risk of surgery to include   the following:  1. Infection (prohylactic antibiotics will be administered)  2. DVT/Pulmonary Embolism (prophylactic pneumo compression stockings will be used)  3.Trauma to internal organs requiring additional surgical procedure to repair any injury to     Internal organs requiring perhaps additional hospitalization days.  4.Hemmorhage requiring transfusion and blood products which carry risks such as  anaphylactic reaction, hepatitis and AIDS  Patient had received literature information on the procedure scheduled and all her questions were answered and fully accepts all risk.  The patient was given prescription for Lortab 7.5/325 1 by mouth every 4-6 hours when necessary postop along with Reglan 10 mg 1 by mouth every 4-6 hours when necessary nausea or vomiting postop.  [facility] HMD5:02 PMTD@Note : This dictation was prepared with  Dragon/digital dictation along withSmart phrase technology. Any transcriptional errors that result from this process are unintentional.      Terrance Mass 11/19/2013, 4:17 PM  Note: This dictation was prepared with  Dragon/digital dictation along withSmart phrase technology. Any transcriptional errors that result from this process are unintentional.

## 2013-11-20 ENCOUNTER — Telehealth: Payer: Self-pay | Admitting: *Deleted

## 2013-11-20 NOTE — Telephone Encounter (Signed)
Patient was called and her questions prior to surgery were answered.

## 2013-11-20 NOTE — Telephone Encounter (Signed)
Pt was seen a post visit yesterday and forgot to ask you regarding her LEEP "Patient also with history of CIN-3 status post LEEP cervical conization with positive ectocervical and endocervical margins with CIN-1 in 2014. She is fully aware not cancer her concern that throughout time margins could turn cancerous? Please advise

## 2013-11-20 NOTE — Telephone Encounter (Signed)
Pt informed with the below she asked if you would please call her on her cell [phone] to speak with you. I told pt I would relay unsure when you would be able to call.

## 2013-11-20 NOTE — Telephone Encounter (Signed)
That's one of the main reasons for the hysterectomy

## 2013-11-26 MED ORDER — CEFOTETAN DISODIUM 2 G IJ SOLR
2.0000 g | INTRAMUSCULAR | Status: AC
  Administered 2013-11-27: 2 g via INTRAVENOUS
  Filled 2013-11-26: qty 2

## 2013-11-27 ENCOUNTER — Telehealth: Payer: Self-pay

## 2013-11-27 ENCOUNTER — Observation Stay (HOSPITAL_COMMUNITY)
Admission: RE | Admit: 2013-11-27 | Discharge: 2013-11-29 | Disposition: A | Discharge status: Home / Self Care | Payer: 59 | Source: Ambulatory Visit | Attending: Gynecology | Admitting: Gynecology

## 2013-11-27 ENCOUNTER — Encounter (HOSPITAL_COMMUNITY): Payer: Self-pay | Admitting: Anesthesiology

## 2013-11-27 ENCOUNTER — Inpatient Hospital Stay (HOSPITAL_COMMUNITY): Payer: 59 | Admitting: Anesthesiology

## 2013-11-27 ENCOUNTER — Encounter (HOSPITAL_COMMUNITY): Payer: 59 | Admitting: Anesthesiology

## 2013-11-27 ENCOUNTER — Encounter (HOSPITAL_COMMUNITY)
Admission: RE | Disposition: A | Discharge status: Home / Self Care | Payer: Self-pay | Source: Ambulatory Visit | Attending: Gynecology

## 2013-11-27 DIAGNOSIS — D649 Anemia, unspecified: Secondary | ICD-10-CM | POA: Insufficient documentation

## 2013-11-27 DIAGNOSIS — Z9889 Other specified postprocedural states: Secondary | ICD-10-CM

## 2013-11-27 DIAGNOSIS — Z87410 Personal history of cervical dysplasia: Secondary | ICD-10-CM

## 2013-11-27 DIAGNOSIS — Z5331 Laparoscopic surgical procedure converted to open procedure: Secondary | ICD-10-CM | POA: Insufficient documentation

## 2013-11-27 DIAGNOSIS — K219 Gastro-esophageal reflux disease without esophagitis: Secondary | ICD-10-CM | POA: Insufficient documentation

## 2013-11-27 DIAGNOSIS — D259 Leiomyoma of uterus, unspecified: Secondary | ICD-10-CM

## 2013-11-27 DIAGNOSIS — N8000 Endometriosis of the uterus, unspecified: Secondary | ICD-10-CM | POA: Insufficient documentation

## 2013-11-27 DIAGNOSIS — N946 Dysmenorrhea, unspecified: Secondary | ICD-10-CM

## 2013-11-27 DIAGNOSIS — N920 Excessive and frequent menstruation with regular cycle: Principal | ICD-10-CM | POA: Insufficient documentation

## 2013-11-27 DIAGNOSIS — D250 Submucous leiomyoma of uterus: Secondary | ICD-10-CM | POA: Insufficient documentation

## 2013-11-27 DIAGNOSIS — F3289 Other specified depressive episodes: Secondary | ICD-10-CM | POA: Insufficient documentation

## 2013-11-27 DIAGNOSIS — A499 Bacterial infection, unspecified: Secondary | ICD-10-CM | POA: Insufficient documentation

## 2013-11-27 DIAGNOSIS — B9689 Other specified bacterial agents as the cause of diseases classified elsewhere: Secondary | ICD-10-CM | POA: Insufficient documentation

## 2013-11-27 DIAGNOSIS — N760 Acute vaginitis: Secondary | ICD-10-CM | POA: Insufficient documentation

## 2013-11-27 DIAGNOSIS — N870 Mild cervical dysplasia: Secondary | ICD-10-CM | POA: Insufficient documentation

## 2013-11-27 DIAGNOSIS — F329 Major depressive disorder, single episode, unspecified: Secondary | ICD-10-CM | POA: Insufficient documentation

## 2013-11-27 DIAGNOSIS — Z8541 Personal history of malignant neoplasm of cervix uteri: Secondary | ICD-10-CM | POA: Insufficient documentation

## 2013-11-27 DIAGNOSIS — D252 Subserosal leiomyoma of uterus: Secondary | ICD-10-CM | POA: Insufficient documentation

## 2013-11-27 HISTORY — PX: BILATERAL SALPINGECTOMY: SHX5743

## 2013-11-27 HISTORY — PX: LAPAROSCOPY: SHX197

## 2013-11-27 HISTORY — PX: ABDOMINAL HYSTERECTOMY: SHX81

## 2013-11-27 SURGERY — HYSTERECTOMY, ABDOMINAL
Anesthesia: General | Site: Abdomen

## 2013-11-27 MED ORDER — IPRATROPIUM BROMIDE 0.06 % NA SOLN: 2.0000 | Freq: Three times a day (TID) | NASAL | Status: DC | PRN

## 2013-11-27 MED ORDER — NEOSTIGMINE METHYLSULFATE 1 MG/ML IJ SOLN
INTRAMUSCULAR | Status: DC | PRN
  Administered 2013-11-27: 3 mg via INTRAVENOUS

## 2013-11-27 MED ORDER — MEPERIDINE HCL 25 MG/ML IJ SOLN: 6.2500 mg | INTRAMUSCULAR | Status: DC | PRN

## 2013-11-27 MED ORDER — DIPHENHYDRAMINE HCL 50 MG/ML IJ SOLN: 12.5000 mg | Freq: Four times a day (QID) | INTRAMUSCULAR | Status: DC | PRN

## 2013-11-27 MED ORDER — BUPIVACAINE HCL (PF) 0.25 % IJ SOLN
INTRAMUSCULAR | Status: AC
  Filled 2013-11-27: qty 30

## 2013-11-27 MED ORDER — HYDROMORPHONE HCL PF 1 MG/ML IJ SOLN
0.2500 mg | INTRAMUSCULAR | Status: DC | PRN
  Administered 2013-11-27 (×4): 0.5 mg via INTRAVENOUS

## 2013-11-27 MED ORDER — KETOROLAC TROMETHAMINE 30 MG/ML IJ SOLN
INTRAMUSCULAR | Status: AC
  Filled 2013-11-27: qty 1

## 2013-11-27 MED ORDER — SODIUM CHLORIDE 0.9 % IJ SOLN: 9.0000 mL | INTRAMUSCULAR | Status: DC | PRN

## 2013-11-27 MED ORDER — 0.9 % SODIUM CHLORIDE (POUR BTL) OPTIME
TOPICAL | Status: DC | PRN
  Administered 2013-11-27: 1000 mL

## 2013-11-27 MED ORDER — NEOSTIGMINE METHYLSULFATE 1 MG/ML IJ SOLN
INTRAMUSCULAR | Status: AC
  Filled 2013-11-27: qty 1

## 2013-11-27 MED ORDER — SODIUM CHLORIDE 0.9 % IJ SOLN
INTRAMUSCULAR | Status: AC
  Filled 2013-11-27: qty 50

## 2013-11-27 MED ORDER — PROPOFOL 10 MG/ML IV EMUL
INTRAVENOUS | Status: AC
  Filled 2013-11-27: qty 20

## 2013-11-27 MED ORDER — HYDROMORPHONE HCL PF 1 MG/ML IJ SOLN
INTRAMUSCULAR | Status: AC
  Administered 2013-11-27: 0.5 mg via INTRAVENOUS
  Filled 2013-11-27: qty 1

## 2013-11-27 MED ORDER — GLYCOPYRROLATE 0.2 MG/ML IJ SOLN
INTRAMUSCULAR | Status: AC
  Filled 2013-11-27: qty 3

## 2013-11-27 MED ORDER — HYDROMORPHONE HCL PF 1 MG/ML IJ SOLN
INTRAMUSCULAR | Status: DC | PRN
  Administered 2013-11-27 (×2): .25 mg via INTRAVENOUS
  Administered 2013-11-27: 0.5 mg via INTRAVENOUS

## 2013-11-27 MED ORDER — ROCURONIUM BROMIDE 100 MG/10ML IV SOLN
INTRAVENOUS | Status: AC
  Filled 2013-11-27: qty 1

## 2013-11-27 MED ORDER — DIPHENHYDRAMINE HCL 12.5 MG/5ML PO ELIX: 12.5000 mg | ORAL_SOLUTION | Freq: Four times a day (QID) | ORAL | Status: DC | PRN

## 2013-11-27 MED ORDER — METOCLOPRAMIDE HCL 5 MG/ML IJ SOLN: 10.0000 mg | Freq: Once | INTRAMUSCULAR | Status: DC | PRN

## 2013-11-27 MED ORDER — FENTANYL CITRATE 0.05 MG/ML IJ SOLN
INTRAMUSCULAR | Status: AC
  Filled 2013-11-27: qty 5

## 2013-11-27 MED ORDER — PROPOFOL 10 MG/ML IV BOLUS
INTRAVENOUS | Status: DC | PRN
  Administered 2013-11-27: 200 mg via INTRAVENOUS

## 2013-11-27 MED ORDER — MIDAZOLAM HCL 2 MG/2ML IJ SOLN
INTRAMUSCULAR | Status: DC | PRN
  Administered 2013-11-27: 2 mg via INTRAVENOUS

## 2013-11-27 MED ORDER — DEXAMETHASONE SODIUM PHOSPHATE 10 MG/ML IJ SOLN
INTRAMUSCULAR | Status: AC
  Filled 2013-11-27: qty 1

## 2013-11-27 MED ORDER — ONDANSETRON HCL 4 MG/2ML IJ SOLN
4.0000 mg | Freq: Four times a day (QID) | INTRAMUSCULAR | Status: DC | PRN
  Administered 2013-11-29: 4 mg via INTRAVENOUS
  Filled 2013-11-27: qty 2

## 2013-11-27 MED ORDER — SCOPOLAMINE 1 MG/3DAYS TD PT72
MEDICATED_PATCH | TRANSDERMAL | Status: AC
  Administered 2013-11-27: 1.5 mg via TRANSDERMAL
  Filled 2013-11-27: qty 1

## 2013-11-27 MED ORDER — FENTANYL CITRATE 0.05 MG/ML IJ SOLN
INTRAMUSCULAR | Status: DC | PRN
  Administered 2013-11-27: 50 ug via INTRAVENOUS
  Administered 2013-11-27: 100 ug via INTRAVENOUS
  Administered 2013-11-27 (×4): 50 ug via INTRAVENOUS

## 2013-11-27 MED ORDER — ONDANSETRON HCL 4 MG/2ML IJ SOLN
INTRAMUSCULAR | Status: AC
  Filled 2013-11-27: qty 2

## 2013-11-27 MED ORDER — GLYCOPYRROLATE 0.2 MG/ML IJ SOLN
INTRAMUSCULAR | Status: DC | PRN
  Administered 2013-11-27: 0.6 mg via INTRAVENOUS

## 2013-11-27 MED ORDER — LIDOCAINE HCL (CARDIAC) 20 MG/ML IV SOLN
INTRAVENOUS | Status: DC | PRN
  Administered 2013-11-27: 60 mg via INTRAVENOUS

## 2013-11-27 MED ORDER — HYDROCODONE-ACETAMINOPHEN 7.5-325 MG PO TABS: 1.0000 | ORAL_TABLET | Freq: Four times a day (QID) | ORAL | Status: DC | PRN

## 2013-11-27 MED ORDER — LIDOCAINE HCL (CARDIAC) 20 MG/ML IV SOLN
INTRAVENOUS | Status: AC
  Filled 2013-11-27: qty 5

## 2013-11-27 MED ORDER — LACTATED RINGERS IV SOLN
INTRAVENOUS | Status: DC
  Administered 2013-11-27 (×2): via INTRAVENOUS
  Administered 2013-11-27: 50 mL/h via INTRAVENOUS
  Administered 2013-11-27: 14:00:00 via INTRAVENOUS

## 2013-11-27 MED ORDER — LACTATED RINGERS IV SOLN
INTRAVENOUS | Status: DC
  Administered 2013-11-28: via INTRAVENOUS

## 2013-11-27 MED ORDER — MORPHINE SULFATE (PF) 1 MG/ML IV SOLN
INTRAVENOUS | Status: DC
  Administered 2013-11-27: 19:00:00 via INTRAVENOUS
  Administered 2013-11-27 – 2013-11-28 (×2): 4 mg via INTRAVENOUS
  Administered 2013-11-28: 5 mg via INTRAVENOUS
  Filled 2013-11-27: qty 25

## 2013-11-27 MED ORDER — FENTANYL CITRATE 0.05 MG/ML IJ SOLN
INTRAMUSCULAR | Status: AC
  Filled 2013-11-27: qty 2

## 2013-11-27 MED ORDER — DEXAMETHASONE SODIUM PHOSPHATE 10 MG/ML IJ SOLN
INTRAMUSCULAR | Status: DC | PRN
  Administered 2013-11-27: 3 mg via INTRAVENOUS
  Administered 2013-11-27: 10 mg via INTRAVENOUS

## 2013-11-27 MED ORDER — HYDROMORPHONE HCL PF 1 MG/ML IJ SOLN
INTRAMUSCULAR | Status: AC
  Filled 2013-11-27: qty 1

## 2013-11-27 MED ORDER — SCOPOLAMINE 1 MG/3DAYS TD PT72
1.0000 | MEDICATED_PATCH | TRANSDERMAL | Status: DC
  Administered 2013-11-27: 1.5 mg via TRANSDERMAL

## 2013-11-27 MED ORDER — NALOXONE HCL 0.4 MG/ML IJ SOLN: 0.4000 mg | INTRAMUSCULAR | Status: DC | PRN

## 2013-11-27 MED ORDER — HYDROCODONE-ACETAMINOPHEN 5-325 MG PO TABS
1.0000 | ORAL_TABLET | Freq: Four times a day (QID) | ORAL | Status: DC | PRN
  Administered 2013-11-28: 1 via ORAL
  Administered 2013-11-28: 2 via ORAL
  Administered 2013-11-28: 1 via ORAL
  Administered 2013-11-29 (×3): 2 via ORAL
  Filled 2013-11-27 (×2): qty 2
  Filled 2013-11-27: qty 1
  Filled 2013-11-27: qty 2
  Filled 2013-11-27: qty 1
  Filled 2013-11-27: qty 2

## 2013-11-27 MED ORDER — MIDAZOLAM HCL 2 MG/2ML IJ SOLN
INTRAMUSCULAR | Status: AC
  Filled 2013-11-27: qty 2

## 2013-11-27 MED ORDER — BUPIVACAINE LIPOSOME 1.3 % IJ SUSP
20.0000 mL | Freq: Once | INTRAMUSCULAR | Status: AC
  Administered 2013-11-27: 20 mL
  Filled 2013-11-27: qty 20

## 2013-11-27 MED ORDER — ONDANSETRON HCL 4 MG/2ML IJ SOLN
INTRAMUSCULAR | Status: DC | PRN
  Administered 2013-11-27: 4 mg via INTRAVENOUS

## 2013-11-27 MED ORDER — ROCURONIUM BROMIDE 100 MG/10ML IV SOLN
INTRAVENOUS | Status: DC | PRN
  Administered 2013-11-27: 10 mg via INTRAVENOUS
  Administered 2013-11-27: 40 mg via INTRAVENOUS
  Administered 2013-11-27: 10 mg via INTRAVENOUS

## 2013-11-27 SURGICAL SUPPLY — 52 items
ADH SKN CLS APL DERMABOND .7 (GAUZE/BANDAGES/DRESSINGS)
APL SKNCLS STERI-STRIP NONHPOA (GAUZE/BANDAGES/DRESSINGS) ×3
BENZOIN TINCTURE PRP APPL 2/3 (GAUZE/BANDAGES/DRESSINGS) ×2 IMPLANT
BLADE SURG 15 STRL LF C SS BP (BLADE) ×3 IMPLANT
BLADE SURG 15 STRL SS (BLADE) ×4
CLOTH BEACON ORANGE TIMEOUT ST (SAFETY) ×4 IMPLANT
CONT PATH 16OZ SNAP LID 3702 (MISCELLANEOUS) ×4 IMPLANT
COVER MAYO STAND STRL (DRAPES) ×4 IMPLANT
COVER TABLE BACK 60X90 (DRAPES) ×4 IMPLANT
DECANTER SPIKE VIAL GLASS SM (MISCELLANEOUS) ×2 IMPLANT
DERMABOND ADVANCED (GAUZE/BANDAGES/DRESSINGS)
DERMABOND ADVANCED .7 DNX12 (GAUZE/BANDAGES/DRESSINGS) IMPLANT
DRSG OPSITE POSTOP 4X10 (GAUZE/BANDAGES/DRESSINGS) ×2 IMPLANT
EVACUATOR SMOKE 8.L (FILTER) ×4 IMPLANT
GAUZE SPONGE 4X4 12PLY STRL LF (GAUZE/BANDAGES/DRESSINGS) ×2 IMPLANT
GLOVE BIOGEL PI IND STRL 8 (GLOVE) ×4 IMPLANT
GLOVE BIOGEL PI INDICATOR 8 (GLOVE) ×2
GLOVE ECLIPSE 7.5 STRL STRAW (GLOVE) ×8 IMPLANT
GOWN STRL REUS W/TWL LRG LVL3 (GOWN DISPOSABLE) ×16 IMPLANT
HEMOSTAT SURGICEL 2X3 (HEMOSTASIS) ×4 IMPLANT
NDL HYPO 18GX1.5 BLUNT FILL (NEEDLE) IMPLANT
NDL SAFETY ECLIPSE 18X1.5 (NEEDLE) ×1 IMPLANT
NDL SPNL 18GX3.5 QUINCKE PK (NEEDLE) ×2 IMPLANT
NEEDLE HYPO 18GX1.5 BLUNT FILL (NEEDLE) ×4 IMPLANT
NEEDLE HYPO 18GX1.5 SHARP (NEEDLE) ×4
NEEDLE SPNL 18GX3.5 QUINCKE PK (NEEDLE) ×4 IMPLANT
NS IRRIG 1000ML POUR BTL (IV SOLUTION) ×4 IMPLANT
PACK LAVH (CUSTOM PROCEDURE TRAY) ×4 IMPLANT
PAD ABD 7.5X8 STRL (GAUZE/BANDAGES/DRESSINGS) ×2 IMPLANT
PROTECTOR NERVE ULNAR (MISCELLANEOUS) ×6 IMPLANT
SET IRRIG TUBING LAPAROSCOPIC (IRRIGATION / IRRIGATOR) ×4 IMPLANT
SHEARS HARMONIC ACE PLUS 36CM (ENDOMECHANICALS) ×4 IMPLANT
STRIP CLOSURE SKIN 1/2X4 (GAUZE/BANDAGES/DRESSINGS) ×2 IMPLANT
SUT VIC AB 0 CT1 18XCR BRD8 (SUTURE) ×6 IMPLANT
SUT VIC AB 0 CT1 27 (SUTURE) ×8
SUT VIC AB 0 CT1 27XBRD ANBCTR (SUTURE) ×6 IMPLANT
SUT VIC AB 0 CT1 8-18 (SUTURE) ×8
SUT VIC AB 3-0 SH 27 (SUTURE) ×4
SUT VIC AB 3-0 SH 27X BRD (SUTURE) ×1 IMPLANT
SUT VIC AB 4-0 KS 27 (SUTURE) ×2 IMPLANT
SUT VICRYL 0 TIES 12 18 (SUTURE) ×4 IMPLANT
SUT VICRYL 0 UR6 27IN ABS (SUTURE) ×4 IMPLANT
SUT VICRYL RAPIDE 3 0 (SUTURE) ×6 IMPLANT
SYR 30ML LL (SYRINGE) ×2 IMPLANT
SYR 50ML LL SCALE MARK (SYRINGE) ×2 IMPLANT
SYR BULB IRRIGATION 50ML (SYRINGE) ×4 IMPLANT
TAPE CLOTH SURG 4X10 WHT LF (GAUZE/BANDAGES/DRESSINGS) ×2 IMPLANT
TOWEL OR 17X24 6PK STRL BLUE (TOWEL DISPOSABLE) ×10 IMPLANT
TRAY FOLEY CATH 14FR (SET/KITS/TRAYS/PACK) ×4 IMPLANT
TROCAR XCEL NON-BLD 11X100MML (ENDOMECHANICALS) ×4 IMPLANT
TROCAR XCEL NON-BLD 5MMX100MML (ENDOMECHANICALS) ×4 IMPLANT
WATER STERILE IRR 1000ML POUR (IV SOLUTION) ×4 IMPLANT

## 2013-11-27 NOTE — Anesthesia Preprocedure Evaluation (Addendum)
Anesthesia Evaluation  Patient identified by MRN, date of birth, ID band Patient awake    Reviewed: Allergy & Precautions, H&P , NPO status , Patient's Chart, lab work & pertinent test results  Airway Mallampati: II TM Distance: >3 FB Neck ROM: Full    Dental no notable dental hx. (+) Teeth Intact   Pulmonary neg pulmonary ROS,  breath sounds clear to auscultation  Pulmonary exam normal       Cardiovascular Exercise Tolerance: Good negative cardio ROS  Rhythm:Regular Rate:Normal     Neuro/Psych  Headaches, PSYCHIATRIC DISORDERS Anxiety Depression    GI/Hepatic Neg liver ROS, GERD-  Medicated and Controlled,  Endo/Other  negative endocrine ROSObesity  Renal/GU negative Renal ROS  negative genitourinary   Musculoskeletal negative musculoskeletal ROS (+)   Abdominal (+) + obese,   Peds  Hematology  (+) anemia ,   Anesthesia Other Findings   Reproductive/Obstetrics Menorrhagia Fibroids Dysmenorrhea                          Anesthesia Physical Anesthesia Plan  ASA: II  Anesthesia Plan: General   Post-op Pain Management:    Induction: Intravenous  Airway Management Planned: Oral ETT  Additional Equipment:   Intra-op Plan:   Post-operative Plan: Extubation in OR  Informed Consent: I have reviewed the patients History and Physical, chart, labs and discussed the procedure including the risks, benefits and alternatives for the proposed anesthesia with the patient or authorized representative who has indicated his/her understanding and acceptance.   Dental advisory given  Plan Discussed with: Anesthesiologist, CRNA and Surgeon  Anesthesia Plan Comments:         Anesthesia Quick Evaluation

## 2013-11-27 NOTE — Anesthesia Postprocedure Evaluation (Signed)
  Anesthesia Post-op Note  Patient: Theresa Miranda  Procedure(s) Performed: Procedure(s): HYSTERECTOMY ABDOMINAL (N/A) LAPAROSCOPY OPERATIVE (N/A) BILATERAL SALPINGECTOMY  Patient Location: PACU  Anesthesia Type:General  Level of Consciousness: awake, alert  and oriented  Airway and Oxygen Therapy: Patient Spontanous Breathing  Post-op Pain: mild  Post-op Assessment: Post-op Vital signs reviewed, Patient's Cardiovascular Status Stable, Respiratory Function Stable, Patent Airway, No signs of Nausea or vomiting and Pain level controlled  Post-op Vital Signs: Reviewed and stable  Last Vitals:  Filed Vitals:   11/27/13 1859  BP:   Pulse:   Temp:   Resp: 12    Complications: No apparent anesthesia complications

## 2013-11-27 NOTE — Transfer of Care (Signed)
Immediate Anesthesia Transfer of Care Note  Patient: Theresa Miranda  Procedure(s) Performed: Procedure(s): HYSTERECTOMY ABDOMINAL (N/A) LAPAROSCOPY OPERATIVE (N/A) BILATERAL SALPINGECTOMY  Patient Location: PACU  Anesthesia Type:General  Level of Consciousness: sedated  Airway & Oxygen Therapy: Patient connected to nasal cannula oxygen  Post-op Assessment: Report given to PACU RN and Post -op Vital signs reviewed and stable  Post vital signs: Reviewed and stable  Complications: No apparent anesthesia complications

## 2013-11-27 NOTE — H&P (View-Only) (Signed)
Theresa Miranda is an 43 y.o. female. For preoperative examination. The patient scheduled for total abdominal hysterectomy with ovarian conservation next week.  The patient with history of CIN-3 status post LEEP cervical conization with positive ectocervical and endocervical margin was CIN-1 and 2014. This was scheduled to return in 6 months for followup Pap smear and colposcopy and continuing close surveillance. Patient has an 8-10 week size irregular shaped uterus (fibroids) . Patient has had history of menorrhagia and had an endometrial ablation via her option technique one half years ago. She states her dysmenorrhea and menorrhagia are worsening. She had been prescribed Lysteda but she stated that it could decrease her bleeding only the first month with the second month things are still the same.  Patient had a sonohysterogram on Jerry 14 2015 with the following findings  Uterus measured 8.6 x 0.9 x 6.1 cm with endometrial stripe of 3.7 mm. Patient had several intramural and subserosal fibroids. Total 8 in quantity the largest one measuring 39 x 22 mm. Both ovaries were otherwise normal. After instilling normal saline into the uterine cavity a right submucosal fibroid encroaching 75% of the uterus and transmural with a measurement of 39 x 27 x 33 mm was noted.   The patient was having a slight vaginal discharge with odor and a wet prep today demonstrated clinical evidence of bacterial vaginosis  Pertinent Gynecological History: Menses: Laring 7 days and heavy Bleeding: intermenstrual bleeding Contraception: tubal ligation DES exposure: denies Blood transfusions: none Sexually transmitted diseases: no past history Previous GYN Procedures: L:EEP [facility], BTL  Last mammogram: no prior study  Last pap: 2014 High grade dysplasia Date: 2014 OB History: G2, P2   Menstrual History: Menarche age 63 Patient's last menstrual period was 11/05/2013.    Past Medical History  Diagnosis Date  .  Depression   . Migraines   . Allergy   . Anemia   . Anxiety   . GERD (gastroesophageal reflux disease)     occ.  . Cancer     stage 0 cervical    Past Surgical History  Procedure Laterality Date  . Tonsillectomy and adenoidectomy  1979  . Carpal tunnel release  1999  . Cervical cerclage  1991, 1996  . Tubal ligation    . Uterine ablation    . Appendectomy    . Wrist arthroscopy Bilateral     Family History  Problem Relation Age of Onset  . Cancer Maternal Grandmother     lung  . Cancer Maternal Grandfather     prostate  . Alcohol abuse Mother   . Cancer Paternal Grandmother     lung  . Stroke Paternal Grandfather     Social History:  reports that she has never smoked. She has never used smokeless tobacco. She reports that she drinks alcohol. She reports that she does not use illicit drugs.  Allergies:  Allergies  Allergen Reactions  . Clindamycin/Lincomycin Anaphylaxis    Pt has "anaphylaxis" to Clindamycin per [facility] pharmacist.  . Azucena Fallen [Azithromycin] Itching     (Not in a hospital admission)  REVIEW OF SYSTEMS: A ROS was performed and pertinent positives and negatives are included in the history.  GENERAL: No fevers or chills. HEENT: No change in vision, no earache, sore throat or sinus congestion. NECK: No pain or stiffness. CARDIOVASCULAR: No chest pain or pressure. No palpitations. PULMONARY: No shortness of breath, cough or wheeze. GASTROINTESTINAL: No abdominal pain, nausea, vomiting or diarrhea, melena or bright red blood per  rectum. GENITOURINARY: No urinary frequency, urgency, hesitancy or dysuria. MUSCULOSKELETAL: No joint or muscle pain, no back pain, no recent trauma. DERMATOLOGIC: No rash, no itching, no lesions. ENDOCRINE: No polyuria, polydipsia, no heat or cold intolerance. No recent change in weight. HEMATOLOGICAL: No anemia or easy bruising or bleeding. NEUROLOGIC: No headache, seizures, numbness, tingling or weakness. PSYCHIATRIC: No  depression, no loss of interest in normal activity or change in sleep pattern.     Blood pressure 122/78, last menstrual period 11/05/2013.  Physical Exam:  HEENT:unremarkable Neck:Supple, midline, no thyroid megaly, no carotid bruits Lungs:  Clear to auscultation no rhonchi's or wheezes Heart:Regular rate and rhythm, no murmurs or gallops Breast Exam: Not done today Abdomen: Soft nontender no rebound or guarding small subumbilical scar noted from previous tubal ligation Pelvic:BUS within normal limits Vagina: Clear discharge with fish like odor Cervix: No lesions or discharge Uterus: 10-12 week size irregular Adnexa: No masses or tenderness Extremities: No cords, no edema Rectal: Not done  Results for orders placed during the hospital encounter of 11/19/13 (from the past 24 hour(s))  URINALYSIS, ROUTINE W REFLEX MICROSCOPIC     Status: Abnormal   Collection Time    11/19/13  2:28 PM      Result Value Ref Range   Color, Urine YELLOW  YELLOW   APPearance CLEAR  CLEAR   Specific Gravity, Urine >1.030 (*) 1.005 - 1.030   pH 5.5  5.0 - 8.0   Glucose, UA NEGATIVE  NEGATIVE mg/dL   Hgb urine dipstick LARGE (*) NEGATIVE   Bilirubin Urine NEGATIVE  NEGATIVE   Ketones, ur NEGATIVE  NEGATIVE mg/dL   Protein, ur NEGATIVE  NEGATIVE mg/dL   Urobilinogen, UA 0.2  0.0 - 1.0 mg/dL   Nitrite NEGATIVE  NEGATIVE   Leukocytes, UA NEGATIVE  NEGATIVE  URINE MICROSCOPIC-ADD ON     Status: Abnormal   Collection Time    11/19/13  2:28 PM      Result Value Ref Range   Squamous Epithelial / LPF FEW (*) RARE   WBC, UA 0-2  <3 WBC/hpf   RBC / HPF 0-2  <3 RBC/hpf   Bacteria, UA RARE  RARE  CBC     Status: None   Collection Time    11/19/13  2:30 PM      Result Value Ref Range   WBC 5.6  4.0 - 10.5 K/uL   RBC 4.12  3.87 - 5.11 MIL/uL   Hemoglobin 12.1  12.0 - 15.0 g/dL   HCT 36.8  36.0 - 46.0 %   MCV 89.3  78.0 - 100.0 fL   MCH 29.4  26.0 - 34.0 pg   MCHC 32.9  30.0 - 36.0 g/dL   RDW 13.1   11.5 - 15.5 %   Platelets 240  150 - 400 K/uL    Assessment/Plan: Patient scheduled to undergo total abdominal hysterectomy with ovarian conservation and bilateral salpingectomy next week as a result of worsening dysmenorrhea, menorrhagia, and 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 Patient also with history of CIN-3 status post LEEP cervical conization with positive ectocervical and endocervical margins with CIN-1 in 2014. We're going to treat her bacterial vaginosis today with MetroGel to apply intravaginally twice a week for 7 days. The following risk for surgery were discussed                        Patient was counseled as to the risk of surgery to include  the following:  1. Infection (prohylactic antibiotics will be administered)  2. DVT/Pulmonary Embolism (prophylactic pneumo compression stockings will be used)  3.Trauma to internal organs requiring additional surgical procedure to repair any injury to     Internal organs requiring perhaps additional hospitalization days.  4.Hemmorhage requiring transfusion and blood products which carry risks such as  anaphylactic reaction, hepatitis and AIDS  Patient had received literature information on the procedure scheduled and all her questions were answered and fully accepts all risk.  The patient was given prescription for Lortab 7.5/325 1 by mouth every 4-6 hours when necessary postop along with Reglan 10 mg 1 by mouth every 4-6 hours when necessary nausea or vomiting postop.  [facility] HMD5:02 PMTD@Note : This dictation was prepared with  Dragon/digital dictation along withSmart phrase technology. Any transcriptional errors that result from this process are unintentional.      Terrance Mass 11/19/2013, 4:17 PM  Note: This dictation was prepared with  Dragon/digital dictation along withSmart phrase technology. Any transcriptional errors that result from this process are unintentional.

## 2013-11-27 NOTE — Telephone Encounter (Signed)
I returned a call to lady at [facility] who called regarding Theresa Miranda's surgery scheduled today. I got her voice mail and left message saying I felt she was probably calling to confirm the case has been changed.  It had been prior authorized for TAH but this morning Dr. Moshe Salisbury changed it to LAVH. I realize LAVH does not require prior auth and assume that is what she is calling about. I did confirm patient now scheduled for LAVH.

## 2013-11-27 NOTE — Op Note (Signed)
11/27/2013  5:12 PM  PATIENT:  Theresa Miranda  43 y.o. female  PRE-OPERATIVE DIAGNOSIS:  Menorrhagia, Fibroids, Cervical Intraepithelial Neoplasia III in 2014  POST-OPERATIVE DIAGNOSIS:  Menorrhagia, Fibroids, Cervical Intraepithelial Neoplasia III in 2014  PROCEDURE:  Procedure(s): HYSTERECTOMY ABDOMINAL LAPAROSCOPY OPERATIVE BILATERAL SALPINGECTOMY  SURGEON:  Surgeon(s): Terrance Mass, MD Anastasio Auerbach, MD  ANESTHESIA:   general  FINDINGS: A 12 week size multilobulated uterus with normal-appearing fallopian tubes and ovaries  DESCRIPTION OF OPERATION:After adequate general endotracheal anesthesia, the patient was placed in dorsal lithotomy position, prepped and draped in the usual manner for a laparoscopic procedure. Patient received her prophylactic antibiotic and had PAS stockings in place as well.  A time out was undertaken for proper identification of the patient and procedure to be performed. A speculum was placed into the vagina. A bimanual exam was performed and then then  A single tooth tenaculum was utilized to grasp the anterior lip of the uterine cervix. The uterus was sounded to  8 cm The single-tooth tenaculum and speculum were removed from the vagina. A foley catheter was inserted to monitor urinary output.  At this time, an infraumbilical  skin incision was made through which a 10/11-mm  Opti-View trocar was introduced through the infraumbilical incision into the abdominal cavity. A pneumoperitoneum was established  With CO2 for approximately 2 1/2 liters. Through the trocar sheath, the laparoscope was inserted and adequate visualization of the pelvic structures was noted. Two  5-mm skin incision were made on the patients right and left lower abdomin under laparoscopic guidance and  two  5-mm trocars were  introduced into the abdominal cavity for instrumentation. Evaluation of the pelvis revealed a 12 week size multilobulated uterus with minimal mobility normal  appearing ovaries and fallopian tubes. The fibroids were encroaching on the cul-de-sac which minimized visualization of the lower portion of the uterus anterior and posterior.. The ureters were noted to be deep in the pelvis. At this time, the right cornu was grasped and the right fallopian tube, uteroovarian ligament, and round ligaments were coaptated and transected  With the Harmonic Scalpel without difficulty. The anterior leaf of the broad ligament was then dissected to the midline bilaterally, establishing a bladder flap with a combination of blunt and sharp dissection.it was determined that the procedure could not be completed laparoscopically because the fibroids were encroaching tightly in the cul-de-sac making it difficult to evaluate and identify and coaptate and transect the uterine arteries. Due to the limited visualization it was decided to convert to laparoscopic procedure to an open laparotomy as had been discussed with the patient as a possible scenario. The laparoscope was removed and the Hulka Tenaculum  was removed. The subumbilical fascia was closed with a figure-of-eight of 0 Vicryl suture and a 3 port sites were reapproximated with Dermabond glue.  The abdominal hysterectomy was begun by performing a Pfannenstiel skin Incision with the first knife. The incision was extended to the level of the fascia using Bovie coagulation. The fascia was scored bilaterally at the midline. The fascial incision was then extended in a curvilinear fashion using the Mayo scissors. The fascia was dissected from the muscular area below by both sharp and blunt dissection. The muscular area was dissected along the midline by both sharp and blunt dissection. The peritoneum was subsequently entered. The incision was then extended in a vertical fashion using Metzenbaum scissors. Immediate notation was made of a large uterine fibroid  uterus. A self-retaining O'Connor-O'Sullivan retractor was placed into  the abdomen.  The abdominal contents were packed in the upper abdomen using 2 clean wet laps. Two large Kelly clamps were placed above the cornual portion of the uterus, and the hysterectomy was begun by transecting the round ligaments, which were subsequently suture ligated with 0 Vicryl suture and tag. Uterovesical peritoneum was subsequently incised with the Metzenbaum scissors, and incision was extended to the sidewall on both side allowing dissection to the sidewall and subsequent visualization of the uterus. Once the uterus was felt free, the infundibulopelvic ligaments were then grasped with 2 curved Haney clamps, subsequently cut and the pedicle was initially free tied with 0 Vicryl and subsequently suture ligated with 0 Vicryl suture leaving both ovaries behind. Bilateral salpingectomy was undertaken by applying a Kelly clamp the mesosalpinx and transecting the right and left fallopian tube respectively is submitted for histological evaluation. The tissue was secured with 0 Vicryl suture. Attention was then turned to the round ligament, which was also secured in the previous fashion. The uterine vessels were subsequently skeletonized, subsequently grasped with curved Heaney clamps, cut and suture ligated with 0 Vicryl suture. Cardinal ligaments on either side were subsequently grasped, cut and suture ligated with 0 Vicryl suture to the level of the uterosacral ligaments, which were then grasped with curved Haney clamps, cut and suture ligated with 0 Vicryl suture. These sutures were left long and tagged with hemostats. Subsequently, 2 large clamps were placed about the upper vaginal cuff. The specimen was subsequently dissected free with the Jorgenson scissors, and the pedicles were suture ligated with 0 Vicryl suture. The vaginal cuff was also closed with 0 Vicryl suture in an interrupted fashion. Subsequently, the pelvis was irrigated until clear. Points of bleeding were secured either by Bovie coagulation or by suture  ligature with a 0 Vicryl suture. Surgicel was placed over the vaginal cuff for additional hemostasis. Once hemostasis was achieved, the O'Connor-O'Sullivan retractor was subsequently removed. Laps were subsequently removed and the abdominal wall was closed in the following fashion. The fascial layers were approximated with 0 Vicryl with sutures coming from the closing angles interlocking every third towards the midline. The subcutaneous layer was reapproximated with 3-0 Vicryl suture. The skin edges were reapproximated with staples. For postoperative analgesia Exparel 1.3% was infiltrated at the level of the peritoneum, fascia and subcutaneous for a total of 30 cc.The patient tolerated the procedure well. The uterus and cervix were passed off the operative field for histological evaluation. The patient was extubated and transferred to recovery room stable vital signs.     Note: This dictation was prepared with  Dragon/digital dictation along withSmart phrase technology. Any transcriptional errors that result from this process are unintentional.   ESTIMATED BLOOD LOSS: 600 cc  Intake/Output Summary (Last 24 hours) at 11/27/13 1712 Last data filed at 11/27/13 1615  Gross per 24 hour  Intake   2800 ml  Output    800 ml  Net   2000 ml     BLOOD ADMINISTERED:none   LOCAL MEDICATIONS USED:  OTHER Exparel at incision sites  SPECIMEN:  Source of Specimen:  Uterus, cervix, bilateral fallopian tubes  DISPOSITION OF SPECIMEN:  PATHOLOGY  COUNTS:  YES  PLAN OF CARE: Transfer to PACU  [facility] HMD5:12 PMTD@  Note: This dictation was prepared with  Dragon/digital dictation along withSmart phrase technology. Any transcriptional errors that result from this process are unintentional.

## 2013-11-27 NOTE — Telephone Encounter (Signed)
Per Dr. Moshe Salisbury I called the OR and changed patient's surgery for today from TAH to" LAVH, Bilateral Salpinectomy, possible Laparotomy".

## 2013-11-27 NOTE — Interval H&P Note (Signed)
History and Physical Interval Note:  11/27/2013 12:49 PM  Theresa Miranda  has presented today for surgery, with the diagnosis of menorrhagia, fibroids, Cervical intraepithelial neoplasia III  The various methods of treatment have been discussed with the patient and family. After consideration of risks, benefits and other options for treatment, the patient has consented to  Procedure(s) with comments: LAPAROSCOPIC ASSISTED VAGINAL HYSTERECTOMY WITH BILATERAL SALPINGECTOMY POSSIBLE LAPAROTOMY (Bilateral) - Dr. Phineas Real to assist. as a surgical intervention .  The patient's history has been reviewed, patient examined, no change in status, stable for surgery.  I have reviewed the patient's chart and labs.  Questions were answered to the patient's satisfaction.     Terrance Mass

## 2013-11-28 ENCOUNTER — Encounter (HOSPITAL_COMMUNITY): Payer: Self-pay | Admitting: Gynecology

## 2013-11-28 LAB — CBC
HCT: 32.9 % — ABNORMAL LOW (ref 36.0–46.0)
Hemoglobin: 11 g/dL — ABNORMAL LOW (ref 12.0–15.0)
MCH: 29.3 pg (ref 26.0–34.0)
MCHC: 33.4 g/dL (ref 30.0–36.0)
MCV: 87.7 fL (ref 78.0–100.0)
Platelets: 232 10*3/uL (ref 150–400)
RBC: 3.75 MIL/uL — AB (ref 3.87–5.11)
RDW: 13.1 % (ref 11.5–15.5)
WBC: 10.4 10*3/uL (ref 4.0–10.5)

## 2013-11-28 NOTE — Addendum Note (Signed)
Addendum created 11/28/13 1623 by Ignacia Bayley, CRNA   Modules edited: Notes Section   Notes Section:  File: [id number]

## 2013-11-28 NOTE — Progress Notes (Signed)
Ur chart review completed.  

## 2013-11-28 NOTE — Progress Notes (Signed)
1 Day Post-Op Procedure(s) (LRB): HYSTERECTOMY ABDOMINAL (N/A) LAPAROSCOPY OPERATIVE (N/A) BILATERAL SALPINGECTOMY  Subjective: Patient reports incisional pain, tolerating PO and no problems voiding.    Objective: I have reviewed patient's vital signs, intake and output, medications and labs.  Hemoglobin and hematocrit 11.0 and 32.9 respectively  General: alert Resp: clear to auscultation bilaterally Cardio: regular rate and rhythm, S1, S2 normal, no murmur, click, rub or gallop GI: soft, non-tender; bowel sounds normal; no masses,  no organomegaly Extremities: extremities normal, atraumatic, no cyanosis or edema Vaginal Bleeding: none incision intact  Assessment: s/p Procedure(s): HYSTERECTOMY ABDOMINAL (N/A) LAPAROSCOPY OPERATIVE (N/A) BILATERAL SALPINGECTOMY: stable, progressing well and tolerating diet  Plan: Advance diet Encourage ambulation Advance to PO medication Discontinue IV fluids  LOS: 1 day    Theresa Miranda 11/28/2013, 8:00 AM

## 2013-11-28 NOTE — Anesthesia Postprocedure Evaluation (Signed)
  Anesthesia Post-op Note  Patient: Theresa Miranda  Procedure(s) Performed: Procedure(s): HYSTERECTOMY ABDOMINAL (N/A) LAPAROSCOPY OPERATIVE (N/A) BILATERAL SALPINGECTOMY  Patient Location: [facility] Unit  Anesthesia Type:General  Level of Consciousness: awake  Airway and Oxygen Therapy: Patient Spontanous Breathing  Post-op Pain: mild  Post-op Assessment: Patient's Cardiovascular Status Stable and Respiratory Function Stable  Post-op Vital Signs: stable  Last Vitals:  Filed Vitals:   11/28/13 1440  BP: 106/63  Pulse: 74  Temp: 37 C  Resp: 18    Complications: No apparent anesthesia complications

## 2013-11-29 MED ORDER — BISACODYL 10 MG RE SUPP
10.0000 mg | Freq: Once | RECTAL | Status: AC
  Administered 2013-11-29: 10 mg via RECTAL
  Filled 2013-11-29: qty 1

## 2013-11-29 MED ORDER — MAGNESIUM HYDROXIDE 400 MG/5ML PO SUSP
15.0000 mL | Freq: Every day | ORAL | Status: DC | PRN
  Administered 2013-11-29: 15 mL via ORAL
  Filled 2013-11-29: qty 30

## 2013-11-29 NOTE — Progress Notes (Signed)
2 Days Post-Op Procedure(s) (LRB): HYSTERECTOMY ABDOMINAL (N/A) LAPAROSCOPY OPERATIVE (N/A) BILATERAL SALPINGECTOMY  Subjective: Patient reports nausea, incisional pain, tolerating PO and no problems voiding.    Objective: I have reviewed patient's vital signs, intake and output, medications and labs.  General: alert and cooperative Resp: clear to auscultation bilaterally Cardio: regular rate and rhythm, S1, S2 normal, no murmur, click, rub or gallop GI: soft, non-tender; bowel sounds normal; no masses,  no organomegaly Extremities: extremities normal, atraumatic, no cyanosis or edema Vaginal Bleeding: none  Assessment: s/p Procedure(s): HYSTERECTOMY ABDOMINAL (N/A) LAPAROSCOPY OPERATIVE (N/A) BILATERAL SALPINGECTOMY: stable, progressing well and Needs to get up more out of bed and advance diet and shower  Plan: Advance diet Encourage ambulation Reasses this pm for possible discharge  LOS: 2 days    Terrance Mass 11/29/2013, 9:15 AM

## 2013-11-29 NOTE — Discharge Summary (Signed)
Physician Discharge Summary  Patient ID: CORBYN STEEDMAN MRN: [id number] DOB/AGE: Apr 11, 1971 43 y.o.  Admit date: 11/27/2013 Discharge date: 11/29/2013  Admission Diagnoses: Menorrhagia, uterine fibroids, CIN-3 history  Discharge Diagnoses: Same   Discharged Condition: good  Hospital Course: Patient was admitted to the hospital April 7 where she was scheduled for laparoscopic-assisted vaginal hysterectomy but due to the size of the uterus an open laparotomy was undertaken she had a total abdominal hysterectomy with bilateral salpingectomy. Patient with history of menorrhagia, uterine fibroids and CIN-3 history. Anesthesia recorded a blood loss of 600 cc. Patient's preoperative hemoglobin was 12.1 and 36.8 respectively postop day #1 hemoglobin and hematocrit were 11.0 and 32.9 with a platelet count 232,000. Patient was kept in the hospital for 2 days and she was slow in getting out of bed and ambulating. Today she was up ambulating tolerating regular diet well had good bowel sounds but had not passed gas yet but voiding spontaneously. She remained afebrile through the entire hospitalization course. And she was ready to be discharged home she had a mild headache for which she took her previously prescribed Maxalt and was normotensive.  Her pathology report was as follows:  Diagnosis Uterus +/- tubes/ovaries, neoplastic - CERVIX: FOCAL LOW GRADE SQUAMOUS INTRAEPITHELIAL LESION, CIN-I. MARGINS NOT INVOLVED. - ENDOMETRIUM: INACTIVE. NO EVIDENCE OF HYPERPLASIA OR CARCINOMA. - MYOMETRIUM: ADENOMYOSIS. LEIOMYOMATA. - UTERINE SEROSA: UNREMARKABLE. NO ENDOMETRIOSIS OR EVIDENCE OF MALIGNANCY. - RIGHT AND LEFT FALLOPIAN TUBES: UNREMARKABLE. NO ENDOMETRIOSIS OR EVIDENCE OF MALIGNANCY.  Consults: None  Significant Diagnostic Studies: Hemoglobin 11.0 hematocrit 32.9  Treatments: surgery: Laparoscopy, abdominal hysterectomy with bilateral salpingectomy  Discharge Exam: Blood pressure 100/51, pulse  87, temperature 97.9 F (36.6 C), temperature source Oral, resp. rate 18, height 5\' 3"  (1.6 m), weight 196 lb (88.905 kg), last menstrual period 11/13/2013, SpO2 98.00%. General appearance: alert and cooperative Cardio: regular rate and rhythm, S1, S2 normal, no murmur, click, rub or gallop GI: soft, non-tender; bowel sounds normal; no masses,  no organomegaly Extremities: extremities normal, atraumatic, no cyanosis or edema Incision/Wound:  Disposition: 01-Home or Self Care, Dulcolax suppository before discharge along with 50 cc milk of magnesia we provided to stimulate her GI track further.  Discharge Orders   Future Appointments Provider Department Dept Phone   12/13/2013 3:30 PM Terrance Mass, MD [facility] [phone]   01/31/2014 9:30 AM Dudley Major, DO [facility] [phone]   Future Orders Complete By Expires   Call MD for:  redness, tenderness, or signs of infection (pain, swelling, bleeding, redness, odor or green/yellow discharge around incision site)  As directed    Call MD for:  severe or increased pain, loss or decreased feeling  in affected limb(s)  As directed    Call MD for:  temperature >100.5  As directed    Discharge instructions  As directed    Driving Restrictions  As directed    Lifting restrictions  As directed    Resume previous diet  As directed        Medication List    STOP taking these medications       metroNIDAZOLE 0.75 % vaginal gel  Commonly known as:  METROGEL      TAKE these medications       BC HEADACHE POWDER PO  Take 1 packet by mouth daily as needed. For headaches     HYDROcodone-acetaminophen 7.5-325 MG per tablet  Commonly known as:  NORCO  Take 1 tablet by mouth every 6 (six) hours  as needed for moderate pain.     ipratropium 0.06 % nasal spray  Commonly known as:  ATROVENT  Place 2 sprays into both nostrils 3 (three) times daily as needed for rhinitis.     LORazepam 0.5 MG tablet   Commonly known as:  ATIVAN  Take 0.5 mg by mouth at bedtime as needed for anxiety.     metoCLOPramide 10 MG tablet  Commonly known as:  REGLAN  Take 1 tablet (10 mg total) by mouth 3 (three) times daily with meals.     nortriptyline 25 MG capsule  Commonly known as:  PAMELOR  Take 1 capsule (25 mg total) by mouth at bedtime.     rizatriptan 10 MG tablet  Commonly known as:  MAXALT  Take 1 tablet (10 mg total) by mouth as needed for migraine. Repeat in 2 hrs if needed.  NEED VISIT!     zolpidem 5 MG tablet  Commonly known as:  AMBIEN  Take 5 mg by mouth at bedtime as needed for sleep.         Signed: Terrance Mass 11/29/2013, 2:01 PM

## 2013-12-03 ENCOUNTER — Encounter: Payer: Self-pay | Admitting: Gynecology

## 2013-12-03 ENCOUNTER — Ambulatory Visit (INDEPENDENT_AMBULATORY_CARE_PROVIDER_SITE_OTHER): Payer: 59 | Admitting: Gynecology

## 2013-12-03 VITALS — BP 120/78

## 2013-12-03 DIAGNOSIS — Z09 Encounter for follow-up examination after completed treatment for conditions other than malignant neoplasm: Secondary | ICD-10-CM

## 2013-12-03 NOTE — Progress Notes (Signed)
   Patient presented to the office today for incision check. Should call this weekend and she thought that her incision port at the umbilicus was red and. She submitted me a picture and it was determined that she had a mild cellulitis so she was placed on dicloxacillin 500 mg 2 tablets initial loading dose followed by 1 tablet 4 times a day for 7 days. Patient was status post attempted laparoscopic-assisted vaginal hysterectomy and was converted to an abdominal hysterectomy. Patient has been passing gas and having bowel movements and urinating well and has been afebrile.  Exam: Incision ports intact no erythema noted Pfannenstiel scar intact Steri-Strips still present abdomen soft no rebound guarding and positive bowel sounds.  Assessment/plan: Patient with cellulitis at the umbilicus port a few days ago has responded to dicloxacillin she will complete a week prescription. She will return back to the office in 2 weeks for a postop visit. Her pathology report was discussed as well as pictures shared with patient.

## 2013-12-05 ENCOUNTER — Telehealth: Payer: Self-pay | Admitting: *Deleted

## 2013-12-05 ENCOUNTER — Ambulatory Visit (INDEPENDENT_AMBULATORY_CARE_PROVIDER_SITE_OTHER): Payer: 59 | Admitting: Physician Assistant

## 2013-12-05 VITALS — BP 94/68 | HR 82 | Temp 98.7°F | Resp 16 | Ht 63.0 in | Wt 188.8 lb

## 2013-12-05 DIAGNOSIS — L259 Unspecified contact dermatitis, unspecified cause: Secondary | ICD-10-CM

## 2013-12-05 DIAGNOSIS — L309 Dermatitis, unspecified: Secondary | ICD-10-CM

## 2013-12-05 DIAGNOSIS — B372 Candidiasis of skin and nail: Secondary | ICD-10-CM

## 2013-12-05 LAB — POCT SKIN KOH: Skin KOH, POC: POSITIVE

## 2013-12-05 MED ORDER — KETOCONAZOLE 2 % EX CREA: 1.0000 "application " | TOPICAL_CREAM | Freq: Two times a day (BID) | CUTANEOUS | Status: DC

## 2013-12-05 NOTE — Telephone Encounter (Signed)
Left the below on pt voicemail. 

## 2013-12-05 NOTE — Progress Notes (Signed)
Subjective:    Patient ID: Theresa Miranda, female    DOB: Jul 03, 1971, 43 y.o.   MRN: [id number]   PCP: Ashok Norris, MD  Chief Complaint  Patient presents with  . Rash    lower abdomen  from steri-strips   Medications, allergies, past medical history, surgical history, family history, social history and problem list reviewed and updated.   HPI  Presents with an itchy rash in the area where she had steri-strips after a recent hysterectomy.  Her surgery was 11/27/2013. On 4/10 she contacted Dr. Toney Rakes with pain, redness and induration at the umbilicus and RIGHT lateral laparoscopic sites.  She was started on Dicloxacillin, and then followed up with Dr. Toney Rakes on 4/13, at which point the cellulitis was improving.  She then noticed a red, itchy rash developing around the steri-strips covering the larger lower scar.  She removed them yesterday, and has redness, intense itching, some swelling. She isn't sure if there is any drainage.  No fever, chills, nausea, vomiting.  She's very sore from her surgery, and has difficulty moving around.  She notes tenderness in the back of both legs since leaving the hospital.  No LE swelling, redness, warmth.  Review of Systems No CP, SOB, HA, dizziness, N/V/D, dysuria, urgency or frequency.    Objective:   Physical Exam  Vitals reviewed. Constitutional: She is oriented to person, place, and time. Vital signs are normal. She appears well-developed and well-nourished. She is active and cooperative. No distress.  BP 94/68  Pulse 82  Temp(Src) 98.7 F (37.1 C) (Oral)  Resp 16  Ht 5\' 3"  (1.6 m)  Wt 188 lb 12.8 oz (85.639 kg)  BMI 33.45 kg/m2  SpO2 98%  LMP 11/05/2013  HENT:  Head: Normocephalic and atraumatic.  Right Ear: Hearing normal.  Left Ear: Hearing normal.  Eyes: Conjunctivae are normal. No scleral icterus.  Neck: Normal range of motion. Neck supple. No thyromegaly present.  Cardiovascular: Normal rate, regular rhythm and  normal heart sounds.   Pulses:      Radial pulses are 2+ on the right side, and 2+ on the left side.  Pulmonary/Chest: Effort normal and breath sounds normal.  Lymphadenopathy:       Head (right side): No tonsillar, no preauricular, no posterior auricular and no occipital adenopathy present.       Head (left side): No tonsillar, no preauricular, no posterior auricular and no occipital adenopathy present.    She has no cervical adenopathy.       Right: No supraclavicular adenopathy present.       Left: No supraclavicular adenopathy present.  Neurological: She is alert and oriented to person, place, and time. No sensory deficit.  Skin: Skin is warm, dry and intact. Rash noted. Rash is maculopapular and pustular. No cyanosis. Nails show no clubbing.     Laparoscopic scars are healing nicely.  No surrounding erythema, edema and no drainage.  Still tender.  No induration.  Hysterectomy scar is well-healed.  No drainage. Surrounding erythematous maculopapular and pustular rash consistent with candida.  Psychiatric: She has a normal mood and affect.     Results for orders placed in visit on 12/05/13  POCT SKIN KOH      Result Value Ref Range   Skin KOH, POC Positive          Assessment & Plan:  1. Dermatitis - POCT Skin KOH  2. Cutaneous candidiasis - ketoconazole (NIZORAL) 2 % cream; Apply 1 application topically 2 (two) times daily.  Dispense: 60 g; Refill: 0  Patient Instructions  Wash the skin daily with a gentle cleanser and water.  Make sure you rinse all the cleanser off, and dry the skin completely. Apply a thin layer of the cream to the rash twice daily until the rash is gone, and then in the area for 5 additional days. Use an OTC antihistamine, like Claritin, Allegra or Zyrtec, to help reduce the reaction and itching.   Return if symptoms worsen or fail to improve.  Fara Chute, PA-C Physician Assistant-Certified [facility]

## 2013-12-05 NOTE — Telephone Encounter (Signed)
She can apply Benadryl lotion when necessary or calamine lotion when necessary as well as take a Benadryl tablet that she can buy over-the-counter once or twice a day

## 2013-12-05 NOTE — Patient Instructions (Signed)
Wash the skin daily with a gentle cleanser and water.  Make sure you rinse all the cleanser off, and dry the skin completely. Apply a thin layer of the cream to the rash twice daily until the rash is gone, and then in the area for 5 additional days. Use an OTC antihistamine, like Claritin, Allegra or Zyrtec, to help reduce the reaction and itching.

## 2013-12-05 NOTE — Telephone Encounter (Signed)
Pt is post op LAVH on 11/27/13 has removed strips from incision site stating now itching in this area, not on the incision site but the ends where strips located. Pt has tried OTC Hydrocortisone Cream but no relief. Pt asked what else could be used? Please advise

## 2013-12-06 NOTE — Telephone Encounter (Signed)
Patient said she ended up going to Urgent Care yesterday and received Rx for Nizoral 2% cream and was diagnosed with a yeast inf. See note below:  12/05/13  Assessment & Plan:   1. Dermatitis  - POCT Skin KOH  2. Cutaneous candidiasis  - ketoconazole (NIZORAL) 2 % cream; Apply 1 application topically 2 (two) times daily. Dispense: 60 g; Refill: 0  Patient Instructions   Wash the skin daily with a gentle cleanser and water. Make sure you rinse all the cleanser off, and dry the skin completely.  Apply a thin layer of the cream to the rash twice daily until the rash is gone, and then in the area for 5 additional days.  Use an OTC antihistamine, like Claritin, Allegra or Zyrtec, to help reduce the reaction and itching.  Return if symptoms worsen or fail to improve.  Fara Chute, PA-C  Physician Assistant-Certified  [facility]  She is calling today because she said the itching is outrageous and she wanted to see if Dr. Moshe Salisbury could recommend anything else. I did relay to her the note from yesterday and Dr. Durenda Guthrie response and I reminded her that the PA at Urgent Care recommended the antihistamines above to help reduce the reaction and itching. She has not tried this yet but will get some Benadryl and give that a try. Call prn.

## 2013-12-10 ENCOUNTER — Encounter (HOSPITAL_COMMUNITY): Payer: Self-pay | Admitting: Emergency Medicine

## 2013-12-10 ENCOUNTER — Telehealth: Payer: Self-pay | Admitting: *Deleted

## 2013-12-10 ENCOUNTER — Emergency Department (HOSPITAL_COMMUNITY)
Admission: EM | Admit: 2013-12-10 | Discharge: 2013-12-10 | Disposition: A | Discharge status: Home / Self Care | Payer: 59 | Source: Home / Self Care | Attending: Family Medicine | Admitting: Family Medicine

## 2013-12-10 DIAGNOSIS — B372 Candidiasis of skin and nail: Secondary | ICD-10-CM

## 2013-12-10 MED ORDER — NYSTATIN-TRIAMCINOLONE 100000-0.1 UNIT/GM-% EX CREA: TOPICAL_CREAM | CUTANEOUS | Status: DC

## 2013-12-10 MED ORDER — ALUM SULFATE-CA ACETATE EX PACK: 1.0000 | PACK | Freq: Three times a day (TID) | CUTANEOUS | Status: DC

## 2013-12-10 MED ORDER — TRAMADOL HCL 50 MG PO TABS: 50.0000 mg | ORAL_TABLET | Freq: Four times a day (QID) | ORAL | Status: DC | PRN

## 2013-12-10 NOTE — Telephone Encounter (Signed)
Pt called requesting another rx for pain medication, still having some pain. Post LAVH on 11/27/13, has ran out of Norco 7.5-325. Taking otc Advil but no relief. Post op appointment on 12/13/13. Please advise

## 2013-12-10 NOTE — ED Notes (Signed)
Pt reports yeast infection below incision wound/groin area onset 1 week Hysterectomy 2 weeks ago States she was seen at [facility]; given a cream w/no relief Also states rash/hives bilateral axilla onset 3 days Denies f/v/n/d Also taking benadryl and cortisone cream w/no relief Alert w/no signs of acute distress.

## 2013-12-10 NOTE — Discharge Instructions (Signed)
Cutaneous Candidiasis Cutaneous candidiasis is a condition in which there is an overgrowth of yeast (candida) on the skin. Yeast normally live on the skin, but in small enough numbers not to cause any symptoms. In certain cases, increased growth of the yeast may cause an actual yeast infection. This kind of infection usually occurs in areas of the skin that are constantly warm and moist, such as the armpits or the groin. Yeast is the most common cause of diaper rash in babies and in people who cannot control their bowel movements (incontinence). CAUSES  The fungus that most often causes cutaneous candidiasis is Candida albicans. Conditions that can increase the risk of getting a yeast infection of the skin include:  Obesity.  Pregnancy.  Diabetes.  Taking antibiotic medicine.  Taking birth control pills.  Taking steroid medicines.  Thyroid disease.  An iron or zinc deficiency.  Problems with the immune system. SYMPTOMS   Red, swollen area of the skin.  Bumps on the skin.  Itchiness. DIAGNOSIS  The diagnosis of cutaneous candidiasis is usually based on its appearance. Light scrapings of the skin may also be taken and viewed under a microscope to identify the presence of yeast. TREATMENT  Antifungal creams may be applied to the infected skin. In severe cases, oral medicines may be needed.  HOME CARE INSTRUCTIONS   Keep your skin clean and dry.  Maintain a healthy weight.  If you have diabetes, keep your blood sugar under control. SEEK IMMEDIATE MEDICAL CARE IF:  Your rash continues to spread despite treatment.  You have a fever, chills, or abdominal pain. Document Released: 04/27/2011 Document Revised: 11/01/2011 Document Reviewed: 04/27/2011 [employer] Patient Information 2014 [employer].  

## 2013-12-10 NOTE — ED Provider Notes (Signed)
CSN: [id number]     Arrival date & time 12/10/13  1108 History   None    Chief Complaint  Patient presents with  . Rash   (Consider location/radiation/quality/duration/timing/severity/associated sxs/prior Treatment) HPI Comments: 43 year old female presents for evaluation of an itchy, burning, painful rash on the bottom of her stomach and in her armpits. She had a hysterectomy 2 weeks ago and subsequent wound infection, successfully treated with oral antibiotics. After that she developed a yeast infection below the fold of her stomach on her lower abdomen, and more recently in her armpits. She was seen at a different urgent care and was diagnosed with a yeast infection on the skin and prescribed ketoconazole cream. The rash has not improved and is now spreading down towards her groin. She has no systemic symptoms. Her abdominal pain is steadily getting better since the surgery. No drainage from her incision sites  Patient is a 43 y.o. female presenting with rash.  Rash Associated symptoms: abdominal pain (improving)   Associated symptoms: no fever, no nausea and not vomiting     Past Medical History  Diagnosis Date  . Depression   . Migraines   . Allergy   . Anemia   . Anxiety   . GERD (gastroesophageal reflux disease)     occ.  . Cancer     stage 0 cervical   Past Surgical History  Procedure Laterality Date  . Tonsillectomy and adenoidectomy  1979  . Carpal tunnel release  1999  . Cervical cerclage  1991, 1996  . Tubal ligation    . Uterine ablation    . Appendectomy    . Wrist arthroscopy Bilateral   . Abdominal hysterectomy N/A 11/27/2013    Procedure: HYSTERECTOMY ABDOMINAL;  Surgeon: Terrance Mass, MD;  Location: [facility];  Service: Gynecology;  Laterality: N/A;  . Laparoscopy N/A 11/27/2013    Procedure: LAPAROSCOPY OPERATIVE;  Surgeon: Terrance Mass, MD;  Location: [facility];  Service: Gynecology;  Laterality: N/A;  . Bilateral salpingectomy  11/27/2013    Procedure:  BILATERAL SALPINGECTOMY;  Surgeon: Terrance Mass, MD;  Location: [facility];  Service: Gynecology;;   Family History  Problem Relation Age of Onset  . Cancer Maternal Grandmother     lung  . Cancer Maternal Grandfather     prostate  . Alcohol abuse Mother   . Cancer Paternal Grandmother     lung  . Stroke Paternal Grandfather   . Drug abuse Father   . HIV/AIDS Father    History  Substance Use Topics  . Smoking status: Never Smoker   . Smokeless tobacco: Never Used  . Alcohol Use: Yes     Comment: occasional   OB History   Grav Para Term Preterm Abortions TAB SAB Ect Mult Living   2 2             Review of Systems  Constitutional: Negative for fever and chills.  Gastrointestinal: Positive for abdominal pain (improving). Negative for nausea and vomiting.  Skin: Positive for rash (See history of present illness).  All other systems reviewed and are negative.   Allergies  Clindamycin/lincomycin and Zithromax  Home Medications   Prior to Admission medications   Medication Sig Start Date End Date Taking? Authorizing Provider  HYDROcodone-acetaminophen (NORCO) 7.5-325 MG per tablet Take 1 tablet by mouth every 6 (six) hours as needed for moderate pain. 11/19/13  Yes Terrance Mass, MD  ketoconazole (NIZORAL) 2 % cream Apply 1 application topically 2 (two) times  daily. 12/05/13  Yes Chelle S Jeffery, PA-C  rizatriptan (MAXALT) 10 MG tablet Take 1 tablet (10 mg total) by mouth as needed for migraine. Repeat in 2 hrs if needed.  NEED VISIT! 07/15/13  Yes Theda Sers, PA-C  Aspirin-Salicylamide-Caffeine (BC HEADACHE POWDER PO) Take 1 packet by mouth daily as needed. For headaches    Historical Provider, MD  LORazepam (ATIVAN) 0.5 MG tablet Take 0.5 mg by mouth at bedtime as needed for anxiety.    Historical Provider, MD  nortriptyline (PAMELOR) 25 MG capsule Take 1 capsule (25 mg total) by mouth at bedtime. 11/01/13   Adam Melvern Sample, DO  zolpidem (AMBIEN) 5 MG tablet Take 5  mg by mouth at bedtime as needed for sleep.    Historical Provider, MD   BP 124/76  Pulse 76  Temp(Src) 98.3 F (36.8 C) (Oral)  Resp 18  SpO2 100%  LMP 11/05/2013 Physical Exam  Nursing note and vitals reviewed. Constitutional: She is oriented to person, place, and time. Vital signs are normal. She appears well-developed and well-nourished. No distress.  HENT:  Head: Normocephalic and atraumatic.  Pulmonary/Chest: Effort normal. No respiratory distress.  Abdominal:    Neurological: She is alert and oriented to person, place, and time. She has normal strength. Coordination normal.  Skin: Skin is warm and dry. Rash (hyperpigmented, slightly weeping rash at the lower abdominal fold with some erythematous papules in the upper mons pubis) noted. Rash is papular (erythematous papules in the bilateral axillae ). She is not diaphoretic.  Psychiatric: She has a normal mood and affect. Judgment normal.    ED Course  Procedures (including critical care time) Labs Review Labs Reviewed - No data to display  Results for orders placed in visit on 12/05/13  POCT SKIN KOH      Result Value Ref Range   Skin KOH, POC Positive     Imaging Review No results found.   MDM   1. Candidal intertrigo    Patient KOH prep that was positive confirming Candida infection. She was treated with ketoconazole cream which does not treat a candida infection. We'll treat with nystatin cream and Domeboro solution.   Meds ordered this encounter  Medications  . nystatin-triamcinolone (MYCOLOG II) cream    Sig: Apply to affected area TID    Dispense:  60 g    Refill:  0    Order Specific Question:  Supervising Provider    Answer:  Lynne Leader, [place]  . aluminum sulfate-calcium acetate (DOMEBORO) packet    Sig: Apply 1 packet topically 3 (three) times daily.    Dispense:  20 each    Refill:  12    Order Specific Question:  Supervising Provider    Answer:  Lynne Leader, [place]      Liam Graham, PA-C 12/10/13 1350

## 2013-12-10 NOTE — Telephone Encounter (Signed)
Pt unable to pick up rx today would like something that can be called in, per JF patient can have ultram 50 mg 1 po every 6 hours # 30, rx called in.

## 2013-12-10 NOTE — Telephone Encounter (Signed)
Prescription for Lortab 5.0/325  One po q 4-6 hrs prn #30

## 2013-12-11 ENCOUNTER — Other Ambulatory Visit: Payer: Self-pay | Admitting: Gynecology

## 2013-12-12 NOTE — ED Provider Notes (Signed)
Medical screening examination/treatment/procedure(s) were performed by a resident physician or non-physician practitioner and as the supervising physician I was immediately available for consultation/collaboration.  Ashish Rossetti, MD    Jamey Demchak S Shanese Riemenschneider, MD 12/12/13 1323 

## 2013-12-13 ENCOUNTER — Encounter: Payer: Self-pay | Admitting: Gynecology

## 2013-12-13 ENCOUNTER — Telehealth: Payer: Self-pay | Admitting: *Deleted

## 2013-12-13 ENCOUNTER — Ambulatory Visit (INDEPENDENT_AMBULATORY_CARE_PROVIDER_SITE_OTHER): Payer: 59 | Admitting: Gynecology

## 2013-12-13 VITALS — BP 126/78

## 2013-12-13 DIAGNOSIS — Z09 Encounter for follow-up examination after completed treatment for conditions other than malignant neoplasm: Secondary | ICD-10-CM

## 2013-12-13 MED ORDER — FLUCONAZOLE 150 MG PO TABS: 150.0000 mg | ORAL_TABLET | Freq: Once | ORAL | Status: DC

## 2013-12-13 NOTE — Telephone Encounter (Signed)
OK. She didn't tell me when she was here. #30 NO REFILLS

## 2013-12-13 NOTE — Progress Notes (Signed)
   Patient presented today for her two-week postoperative visit patient is status post abdominal hysterectomy with bilateral salpingectomy as a result of menorrhagia, fibroids, and CIN-3.  Intraoperative findings: 12 week multilobulated uterus with normal-appearing fallopian tubes and ovaries  Pathology report: Diagnosis Uterus +/- tubes/ovaries, neoplastic - CERVIX: FOCAL LOW GRADE SQUAMOUS INTRAEPITHELIAL LESION, CIN-I. MARGINS NOT INVOLVED. - ENDOMETRIUM: INACTIVE. NO EVIDENCE OF HYPERPLASIA OR CARCINOMA. - MYOMETRIUM: ADENOMYOSIS. LEIOMYOMATA. - UTERINE SEROSA: UNREMARKABLE. NO ENDOMETRIOSIS OR EVIDENCE OF MALIGNANCY. - RIGHT AND LEFT FALLOPIAN TUBES: UNREMARKABLE. NO ENDOMETRIOSIS OR EVIDENCE OF MALIGNANCY.  Patient has done well since her surgery some abdominal discomfort as to be expected. Patient is voiding ambulating and having normal bowel movements. She did complain of some incisional pruritus which she took Diflucan and over-the-counter antifungal agent.  Exam: Abdomen: Incision sites intact Pelvic: Vaginal cuff intact Bimanual exam no palpable masses or tenderness Rectal exam: Not done  Assessment/plan: Patient 2 weeks status post abdominal hysterectomy with bilateral salpingectomy secondary to leiomyomatous uteri and CIN-3. Pathology demonstrating evidence of endometriosis, leiomyoma and CIN-1 with margins clear. Patient is scheduled to return back to the office in 4 weeks for final postoperative visit. Pictures as well as findings during surgery were discussed with the patient. Pathology report was also discussed. All questions answered.

## 2013-12-13 NOTE — Telephone Encounter (Signed)
See telephone encounter note 12/10/13. Pt asked if okay to still have Rx for lortab 5/325? Pt is here in office waiting if okay I will print and give to patient. Please advise

## 2013-12-14 MED ORDER — HYDROCODONE-ACETAMINOPHEN 5-325 MG PO TABS: 1.0000 | ORAL_TABLET | Freq: Four times a day (QID) | ORAL | Status: DC | PRN

## 2013-12-14 NOTE — Telephone Encounter (Signed)
Pt said she was still having some discomfort on right abdomen area which was discussed with MD. She would still like Rx, will not use everyday just as needed

## 2013-12-14 NOTE — Telephone Encounter (Signed)
Unable to leave message on pt cell #

## 2013-12-25 ENCOUNTER — Ambulatory Visit: Payer: Self-pay | Admitting: Family Medicine

## 2014-01-08 ENCOUNTER — Ambulatory Visit (INDEPENDENT_AMBULATORY_CARE_PROVIDER_SITE_OTHER): Payer: 59 | Admitting: Gynecology

## 2014-01-08 ENCOUNTER — Encounter: Payer: Self-pay | Admitting: Gynecology

## 2014-01-08 VITALS — BP 120/70

## 2014-01-08 DIAGNOSIS — Z09 Encounter for follow-up examination after completed treatment for conditions other than malignant neoplasm: Secondary | ICD-10-CM

## 2014-01-08 DIAGNOSIS — D649 Anemia, unspecified: Secondary | ICD-10-CM

## 2014-01-08 NOTE — Progress Notes (Signed)
   Patient presented to the office for her six-week postop exam. Patient is status post abdominal hysterectomy with bilateral salpingectomy as a result of menorrhagia, fibroids and CIN-3. The CIN-3 had positive margins at time of LEEP conization.  Pathology report from her hysterectomy demonstrated the following:  Intraoperative findings:  12 week multilobulated uterus with normal-appearing fallopian tubes and ovaries  Pathology report:  Diagnosis  Uterus +/- tubes/ovaries, neoplastic  - CERVIX: FOCAL LOW GRADE SQUAMOUS INTRAEPITHELIAL LESION, CIN-I. MARGINS NOT  INVOLVED.  - ENDOMETRIUM: INACTIVE. NO EVIDENCE OF HYPERPLASIA OR CARCINOMA.  - MYOMETRIUM: ADENOMYOSIS. LEIOMYOMATA.  - UTERINE SEROSA: UNREMARKABLE. NO ENDOMETRIOSIS OR EVIDENCE OF MALIGNANCY.  - RIGHT AND LEFT FALLOPIAN TUBES: UNREMARKABLE. NO ENDOMETRIOSIS OR EVIDENCE OF  MALIGNANCY.  Patient is doing well and ready to return back to work next week. She was taking iron supplementation for her anemia. Her postop hemoglobin was 11.0 and her preop hemoglobin was 12.1.  Exam: Abdomen: Soft nontender no rebound or guarding Pfannenstiel incision intact Pelvic exam: Bartholin urethra Skene was within normal limits Vagina: No lesions or discharge vaginal cuff intact Bimanual exam no palpable mass or tenderness Rectal exam: Not done  Assessment/plan: Patient doing well 6 weeks status post total abdominal hysterectomy with bilateral salpingectomy secondary to symptomatic leiomyomatous uteri and CIN-3 will be released to go back to work next week with no restriction. We will check her CBC today and then she can stop her iron supplementation.

## 2014-01-31 ENCOUNTER — Ambulatory Visit: Payer: 59 | Admitting: Neurology

## 2014-03-08 ENCOUNTER — Encounter (HOSPITAL_COMMUNITY): Payer: Self-pay | Admitting: Emergency Medicine

## 2014-03-08 ENCOUNTER — Emergency Department (HOSPITAL_COMMUNITY)
Admission: EM | Admit: 2014-03-08 | Discharge: 2014-03-09 | Disposition: A | Discharge status: Home / Self Care | Payer: 59 | Attending: Emergency Medicine | Admitting: Emergency Medicine

## 2014-03-08 DIAGNOSIS — S40269A Insect bite (nonvenomous) of unspecified shoulder, initial encounter: Secondary | ICD-10-CM | POA: Insufficient documentation

## 2014-03-08 DIAGNOSIS — Z862 Personal history of diseases of the blood and blood-forming organs and certain disorders involving the immune mechanism: Secondary | ICD-10-CM | POA: Insufficient documentation

## 2014-03-08 DIAGNOSIS — Z8719 Personal history of other diseases of the digestive system: Secondary | ICD-10-CM | POA: Insufficient documentation

## 2014-03-08 DIAGNOSIS — F411 Generalized anxiety disorder: Secondary | ICD-10-CM | POA: Insufficient documentation

## 2014-03-08 DIAGNOSIS — Y9389 Activity, other specified: Secondary | ICD-10-CM | POA: Insufficient documentation

## 2014-03-08 DIAGNOSIS — Y9289 Other specified places as the place of occurrence of the external cause: Secondary | ICD-10-CM | POA: Insufficient documentation

## 2014-03-08 DIAGNOSIS — Z859 Personal history of malignant neoplasm, unspecified: Secondary | ICD-10-CM | POA: Insufficient documentation

## 2014-03-08 DIAGNOSIS — G43909 Migraine, unspecified, not intractable, without status migrainosus: Secondary | ICD-10-CM | POA: Insufficient documentation

## 2014-03-08 DIAGNOSIS — W57XXXA Bitten or stung by nonvenomous insect and other nonvenomous arthropods, initial encounter: Secondary | ICD-10-CM | POA: Insufficient documentation

## 2014-03-08 MED ORDER — HYDROXYZINE HCL 25 MG PO TABS
25.0000 mg | ORAL_TABLET | Freq: Once | ORAL | Status: AC
  Administered 2014-03-09: 25 mg via ORAL
  Filled 2014-03-08: qty 1

## 2014-03-08 MED ORDER — FAMOTIDINE 20 MG PO TABS
20.0000 mg | ORAL_TABLET | Freq: Once | ORAL | Status: AC
  Administered 2014-03-09: 20 mg via ORAL
  Filled 2014-03-08: qty 1

## 2014-03-08 MED ORDER — PREDNISONE 20 MG PO TABS
60.0000 mg | ORAL_TABLET | Freq: Once | ORAL | Status: AC
  Administered 2014-03-09: 60 mg via ORAL
  Filled 2014-03-08: qty 3

## 2014-03-08 NOTE — ED Notes (Signed)
The pt woke up this am in bed and she had some type bites to her lt upper arm.  2 circular areas inside a large hive-like  Lesion.  Painful sl itching.  No other areas on her body  lmp hyst

## 2014-03-08 NOTE — ED Provider Notes (Signed)
CSN: [id number]     Arrival date & time 03/08/14  2316 History  This chart was scribed for a non-physician practitioner, Abigail Butts, PA-C, working with Kalman Drape, MD by Cathie Hoops, ED Scribe. The patient was seen in TR05C/TR05C. The patient's care was started at 11:49 PM.  Chief Complaint  Patient presents with  . Insect Bite   Patient is a 43 y.o. female presenting with animal bite. The history is provided by the patient and medical records. No language interpreter was used.  Animal Bite Contact animal:  Insect Location:  Shoulder/arm Shoulder/arm injury location:  L upper arm Time since incident:  12 hours Pain details:    Quality:  Itching   Severity:  Moderate   Timing:  Constant   Progression:  Worsening Incident location:  Home Provoked: unprovoked   Associated symptoms: rash (right upper arm)   Associated symptoms: no fever    HPI Comments: Theresa Miranda is a 43 y.o. female who presents to the Emergency Department complaining of insect bite to her left upper arm onset. Patient states she had 2 large, hive-like lesions when she woke up this morning that have increased in size. Pt states her lesions are painful and itchy. Pt denies seeing anything in the bed. Pt states she scratched her lesions earlier in the day. Pt denies using any topical ointments or benadryl to relieve her symptoms. Pt denies having a history of diabetes.   Past Medical History  Diagnosis Date  . Depression   . Migraines   . Allergy   . Anemia   . Anxiety   . GERD (gastroesophageal reflux disease)     occ.  . Cancer     stage 0 cervical   Past Surgical History  Procedure Laterality Date  . Tonsillectomy and adenoidectomy  1979  . Carpal tunnel release  1999  . Cervical cerclage  1991, 1996  . Tubal ligation    . Uterine ablation    . Appendectomy    . Wrist arthroscopy Bilateral   . Abdominal hysterectomy N/A 11/27/2013    Procedure: HYSTERECTOMY ABDOMINAL;  Surgeon: Terrance Mass, MD;  Location: [facility];  Service: Gynecology;  Laterality: N/A;  . Laparoscopy N/A 11/27/2013    Procedure: LAPAROSCOPY OPERATIVE;  Surgeon: Terrance Mass, MD;  Location: [facility];  Service: Gynecology;  Laterality: N/A;  . Bilateral salpingectomy  11/27/2013    Procedure: BILATERAL SALPINGECTOMY;  Surgeon: Terrance Mass, MD;  Location: [facility];  Service: Gynecology;;   Family History  Problem Relation Age of Onset  . Cancer Maternal Grandmother     lung  . Cancer Maternal Grandfather     prostate  . Alcohol abuse Mother   . Cancer Paternal Grandmother     lung  . Stroke Paternal Grandfather   . Drug abuse Father   . HIV/AIDS Father    History  Substance Use Topics  . Smoking status: Never Smoker   . Smokeless tobacco: Never Used  . Alcohol Use: Yes     Comment: occasional   OB History   Grav Para Term Preterm Abortions TAB SAB Ect Mult Living   2 2             Review of Systems  Constitutional: Negative for fever and chills.  Gastrointestinal: Negative for nausea and vomiting.  Skin: Positive for rash (right upper arm).  Allergic/Immunologic: Negative for immunocompromised state.  Hematological: Does not bruise/bleed easily.  Psychiatric/Behavioral: The patient is not nervous/anxious.  Allergies  Clindamycin/lincomycin and Zithromax  Home Medications   Prior to Admission medications   Medication Sig Start Date End Date Taking? Authorizing Provider  aluminum sulfate-calcium acetate (DOMEBORO) packet Apply 1 packet topically 3 (three) times daily. 12/10/13   Liam Graham, PA-C  Aspirin-Salicylamide-Caffeine (BC HEADACHE POWDER PO) Take 1 packet by mouth daily as needed. For headaches    Historical Provider, MD  cephALEXin (KEFLEX) 500 MG capsule Take 1 capsule (500 mg total) by mouth 4 (four) times daily. 03/09/14   Shizuo Biskup, PA-C  fluconazole (DIFLUCAN) 150 MG tablet Take 1 tablet (150 mg total) by mouth once. 12/13/13   Terrance Mass,  MD  fluconazole (DIFLUCAN) 150 MG tablet Take 1 tablet (150 mg total) by mouth once. 03/09/14   Jaedan Huttner, PA-C  HYDROcodone-acetaminophen (NORCO) 7.5-325 MG per tablet Take 1 tablet by mouth every 6 (six) hours as needed for moderate pain. 11/19/13   Terrance Mass, MD  HYDROcodone-acetaminophen (NORCO/VICODIN) 5-325 MG per tablet Take 1 tablet by mouth every 6 (six) hours as needed for moderate pain. 12/14/13   Terrance Mass, MD  hydrocortisone 2.5 % lotion Apply topically 2 (two) times daily. 03/09/14   Duston Smolenski, PA-C  hydrOXYzine (ATARAX/VISTARIL) 25 MG tablet Take 1 tablet (25 mg total) by mouth every 6 (six) hours. 03/09/14   Amarri Michaelson, PA-C  ketoconazole (NIZORAL) 2 % cream Apply 1 application topically 2 (two) times daily. 12/05/13   Chelle S Jeffery, PA-C  LORazepam (ATIVAN) 0.5 MG tablet Take 0.5 mg by mouth at bedtime as needed for anxiety.    Historical Provider, MD  nortriptyline (PAMELOR) 25 MG capsule Take 1 capsule (25 mg total) by mouth at bedtime. 11/01/13   Adam Melvern Sample, DO  nystatin-triamcinolone [facility]) cream Apply to affected area TID 12/10/13   Liam Graham, PA-C  predniSONE (DELTASONE) 20 MG tablet Take 2 tablets (40 mg total) by mouth daily. 03/09/14   Ligia Duguay, PA-C  rizatriptan (MAXALT) 10 MG tablet Take 1 tablet (10 mg total) by mouth as needed for migraine. Repeat in 2 hrs if needed.  NEED VISIT! 07/15/13   Theda Sers, PA-C  traMADol (ULTRAM) 50 MG tablet Take 1 tablet (50 mg total) by mouth every 6 (six) hours as needed. 12/10/13   Terrance Mass, MD  zolpidem (AMBIEN) 5 MG tablet Take 5 mg by mouth at bedtime as needed for sleep.    Historical Provider, MD   Triage Vitals: BP 106/71  Pulse 66  Temp(Src) 98.1 F (36.7 C)  Resp 18  Ht 5\' 2"  (1.575 m)  Wt 189 lb (85.73 kg)  BMI 34.56 kg/m2  SpO2 98%  LMP 11/05/2013 Physical Exam  Nursing note and vitals reviewed. Constitutional: She appears well-developed  and well-nourished. No distress.  Awake, alert, nontoxic appearance  HENT:  Head: Normocephalic and atraumatic.  Mouth/Throat: Oropharynx is clear and moist. No oropharyngeal exudate.  Eyes: Conjunctivae are normal. No scleral icterus.  Neck: Normal range of motion. Neck supple.  Cardiovascular: Normal rate, regular rhythm, normal heart sounds and intact distal pulses.   No murmur heard. Pulmonary/Chest: Effort normal and breath sounds normal. No respiratory distress. She has no wheezes.  Abdominal: Soft. Bowel sounds are normal. She exhibits no mass. There is no tenderness. There is no rebound and no guarding.  Musculoskeletal: Normal range of motion. She exhibits no edema.  Neurological: She is alert.  Speech is clear and goal oriented Moves extremities without ataxia  Skin: Skin  is warm and dry. Rash noted. She is not diaphoretic. There is erythema.  Large area of raised erythema and minimal induration without fluctuance to the left upper medial arm; area is excoriated  Psychiatric: She has a normal mood and affect.    ED Course  Procedures (including critical care time) DIAGNOSTIC STUDIES: Oxygen Saturation is 98% on RA, normal by my interpretation.    COORDINATION OF CARE: 11:52 PM- Will prescribe Atarax, Deltasone and pepcid. Patient informed of current plan for treatment and evaluation and agrees with plan at this time.    Labs Review Labs Reviewed - No data to display  Imaging Review No results found.   EKG Interpretation None      MDM   Final diagnoses:  Insect bite   ENES WEGENER presents with questionable insect bite to the left upper medial arm. Patient with raised erythematous patch, excoriated.  Mild induration at the site without increased warmth or area of fluctuance.  Area is more consistent with local inflammatory reaction as opposed to secondary infection.  Patient given prednisone, Pepcid and Atarax here in the emergency department with mild  improvement.  We'll discharge home with medications for local inflammation, itching and keflex for possible secondary infection.  Patient without signs or symptoms of systemic allergic reaction.    I have personally reviewed patient's vitals, nursing note and any pertinent labs or imaging.  I performed an undressed physical exam.    At this time, it has been determined that no acute conditions requiring further emergency intervention. The patient/guardian have been advised of the diagnosis and plan. I reviewed all labs and imaging including any potential incidental findings. We have discussed signs and symptoms that warrant return to the ED, such as wheezing or shortness of breath, wheezing, area of purulence or fluctuants, necrotic tissue.  Patient/guardian has voiced understanding and agreed to follow-up with the PCP or specialist in 3 days or sooner if symptoms progress.  Vital signs are stable at discharge.   BP 106/71  Pulse 66  Temp(Src) 98.1 F (36.7 C)  Resp 18  Ht 5\' 2"  (1.575 m)  Wt 189 lb (85.73 kg)  BMI 34.56 kg/m2  SpO2 98%  LMP 11/05/2013      I personally performed the services described in this documentation, which was scribed in my presence. The recorded information has been reviewed and is accurate.   Jarrett Soho Ajeet Casasola, PA-C 03/09/14 [phone]

## 2014-03-09 MED ORDER — FLUCONAZOLE 150 MG PO TABS: 150.0000 mg | ORAL_TABLET | Freq: Once | ORAL | Status: DC

## 2014-03-09 MED ORDER — CEPHALEXIN 500 MG PO CAPS: 500.0000 mg | ORAL_CAPSULE | Freq: Four times a day (QID) | ORAL | Status: DC

## 2014-03-09 MED ORDER — HYDROCORTISONE 2.5 % EX LOTN: TOPICAL_LOTION | Freq: Two times a day (BID) | CUTANEOUS | Status: DC

## 2014-03-09 MED ORDER — HYDROXYZINE HCL 25 MG PO TABS: 25.0000 mg | ORAL_TABLET | Freq: Four times a day (QID) | ORAL | Status: DC

## 2014-03-09 MED ORDER — PREDNISONE 20 MG PO TABS: 40.0000 mg | ORAL_TABLET | Freq: Every day | ORAL | Status: DC

## 2014-03-09 NOTE — Discharge Instructions (Signed)
1. Medications: Cortisone, Atarax, prednisone, Diflucan, Keflex, usual home medications 2. Treatment: rest, drink plenty of fluids, keep area clean and dry 3. Follow Up: Please followup with your primary doctor in 3 days for discussion of your diagnoses and further evaluation after today's visit; if you do not have a primary care doctor use the resource guide provided to find one;    Insect Bite Mosquitoes, flies, fleas, bedbugs, and many other insects can bite. Insect bites are different from insect stings. A sting is when venom is injected into the skin. Some insect bites can transmit infectious diseases. SYMPTOMS  Insect bites usually turn red, swell, and itch for 2 to 4 days. They often go away on their own. TREATMENT  Your caregiver may prescribe antibiotic medicines if a bacterial infection develops in the bite. HOME CARE INSTRUCTIONS  Do not scratch the bite area.  Keep the bite area clean and dry. Wash the bite area thoroughly with soap and water.  Put ice or cool compresses on the bite area.  Put ice in a plastic bag.  Place a towel between your skin and the bag.  Leave the ice on for 20 minutes, 4 times a day for the first 2 to 3 days, or as directed.  You may apply a baking soda paste, cortisone cream, or calamine lotion to the bite area as directed by your caregiver. This can help reduce itching and swelling.  Only take over-the-counter or prescription medicines as directed by your caregiver.  If you are given antibiotics, take them as directed. Finish them even if you start to feel better. You may need a tetanus shot if:  You cannot remember when you had your last tetanus shot.  You have never had a tetanus shot.  The injury broke your skin. If you get a tetanus shot, your arm may swell, get red, and feel warm to the touch. This is common and not a problem. If you need a tetanus shot and you choose not to have one, there is a rare chance of getting tetanus. Sickness  from tetanus can be serious. SEEK IMMEDIATE MEDICAL CARE IF:   You have increased pain, redness, or swelling in the bite area.  You see a red line on the skin coming from the bite.  You have a fever.  You have joint pain.  You have a headache or neck pain.  You have unusual weakness.  You have a rash.  You have chest pain or shortness of breath.  You have abdominal pain, nausea, or vomiting.  You feel unusually tired or sleepy. MAKE SURE YOU:   Understand these instructions.  Will watch your condition.  Will get help right away if you are not doing well or get worse. Document Released: 09/16/2004 Document Revised: 11/01/2011 Document Reviewed: 03/10/2011 [facility] Patient Information 2015 [place], [place]. This information is not intended to replace advice given to you by your health care provider. Make sure you discuss any questions you have with your health care provider.

## 2014-03-09 NOTE — ED Provider Notes (Signed)
Medical screening examination/treatment/procedure(s) were performed by non-physician practitioner and as supervising physician I was immediately available for consultation/collaboration.   EKG Interpretation None       Kalman Drape, MD 03/09/14 [phone]

## 2014-03-09 NOTE — ED Notes (Signed)
Pt has ? Insect bite to left upper inner arm.  St's she noticed when she woke up this am.  St's area has gotton bigger and has been itching.

## 2014-03-25 DIAGNOSIS — R519 Headache, unspecified: Secondary | ICD-10-CM | POA: Insufficient documentation

## 2014-06-24 ENCOUNTER — Encounter (HOSPITAL_COMMUNITY): Payer: Self-pay | Admitting: Emergency Medicine

## 2014-12-20 ENCOUNTER — Encounter: Payer: Self-pay | Admitting: Gynecology

## 2015-05-03 IMAGING — CR DG ABDOMEN 2V
1 series · 3 of 3 positions shown · non-contrast
Comparison: None.

CLINICAL DATA: Postop hysterectomy.  Nausea, vomiting.

EXAM:
ABDOMEN - 2 VIEW

[Series 1: erect ap · 0.17mm/px · 3 of 3 slices shown]
[im 1/3]
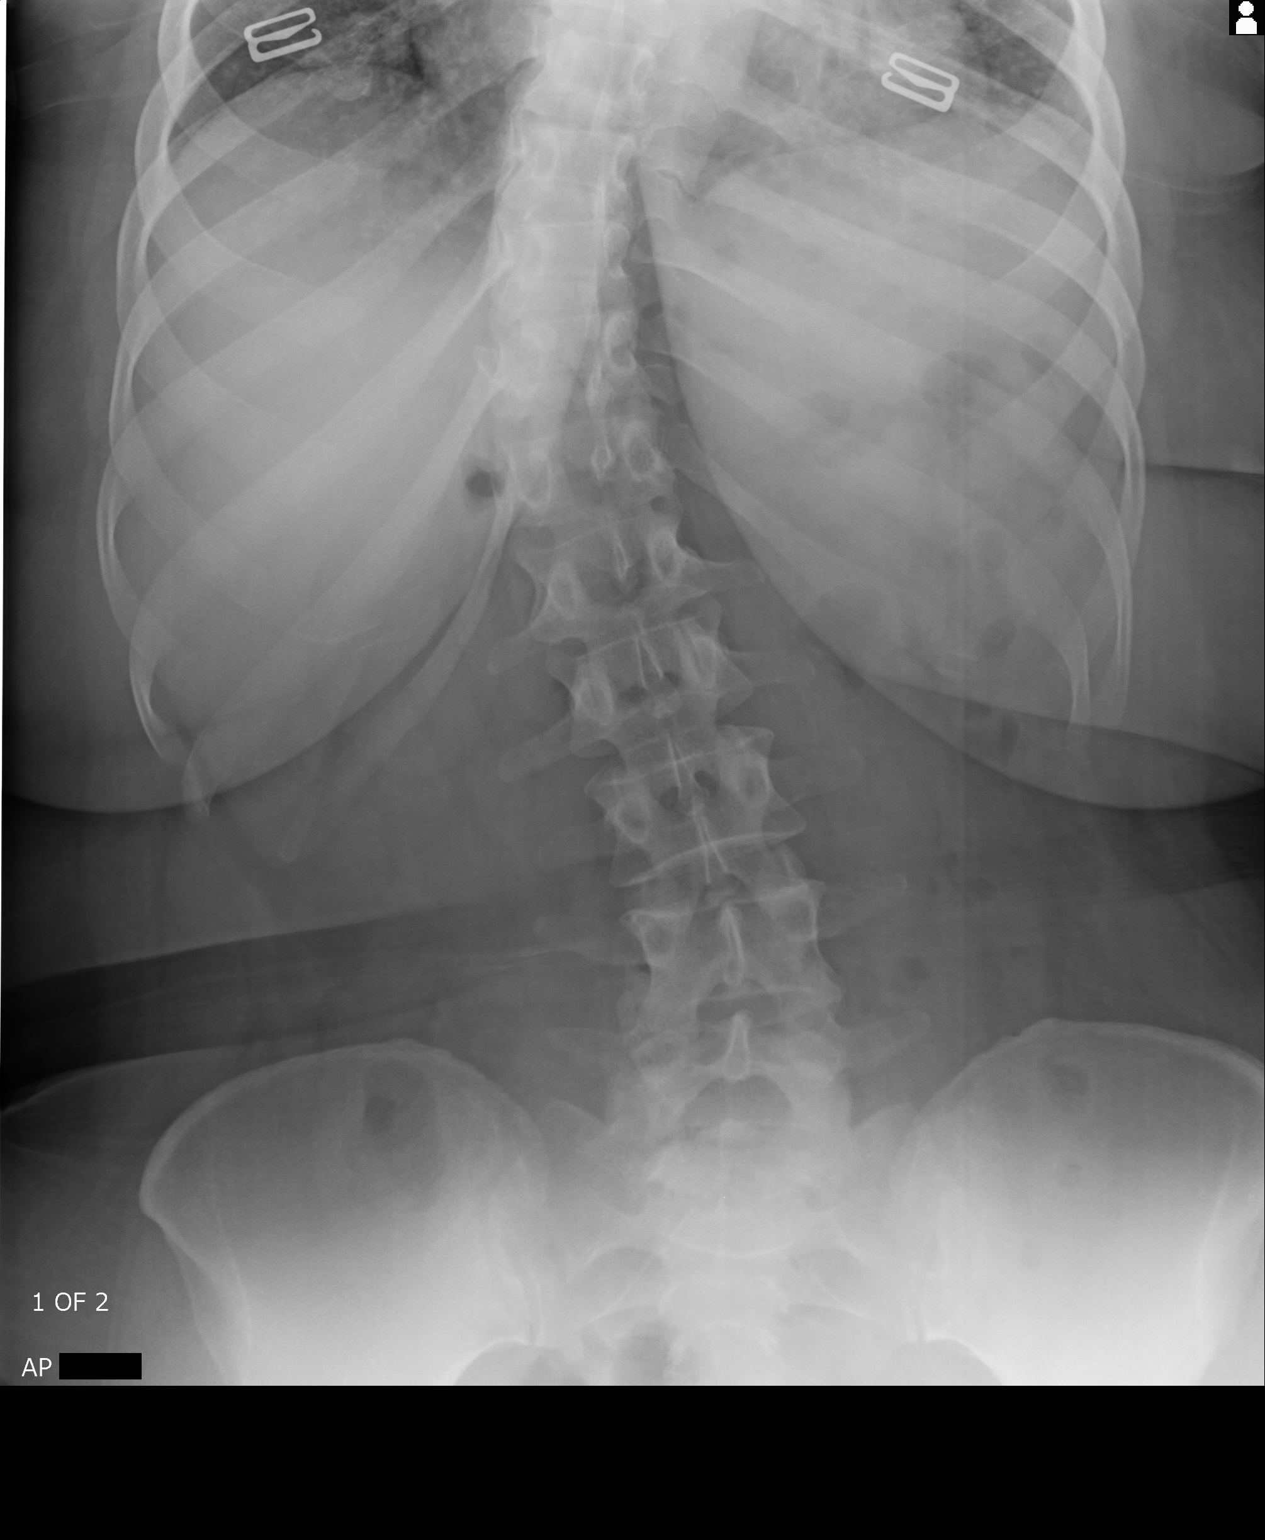
[im 2/3]
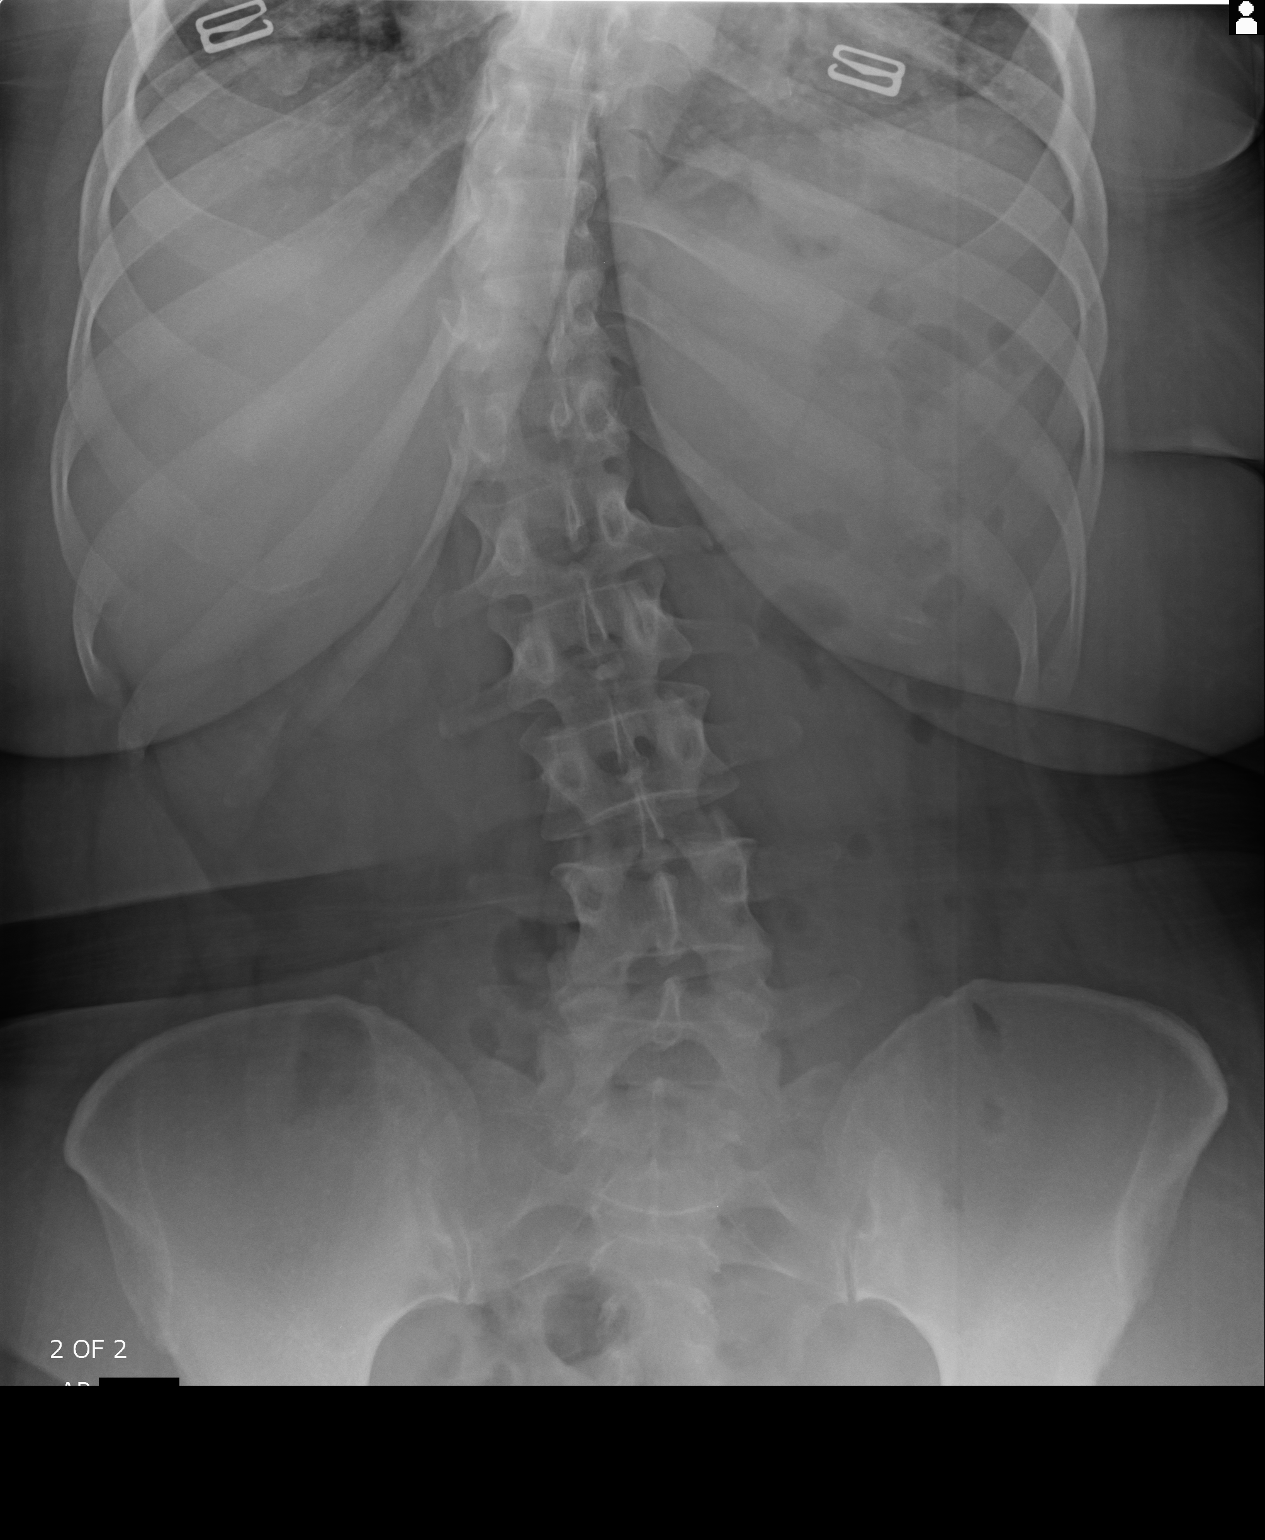
[im 3/3]
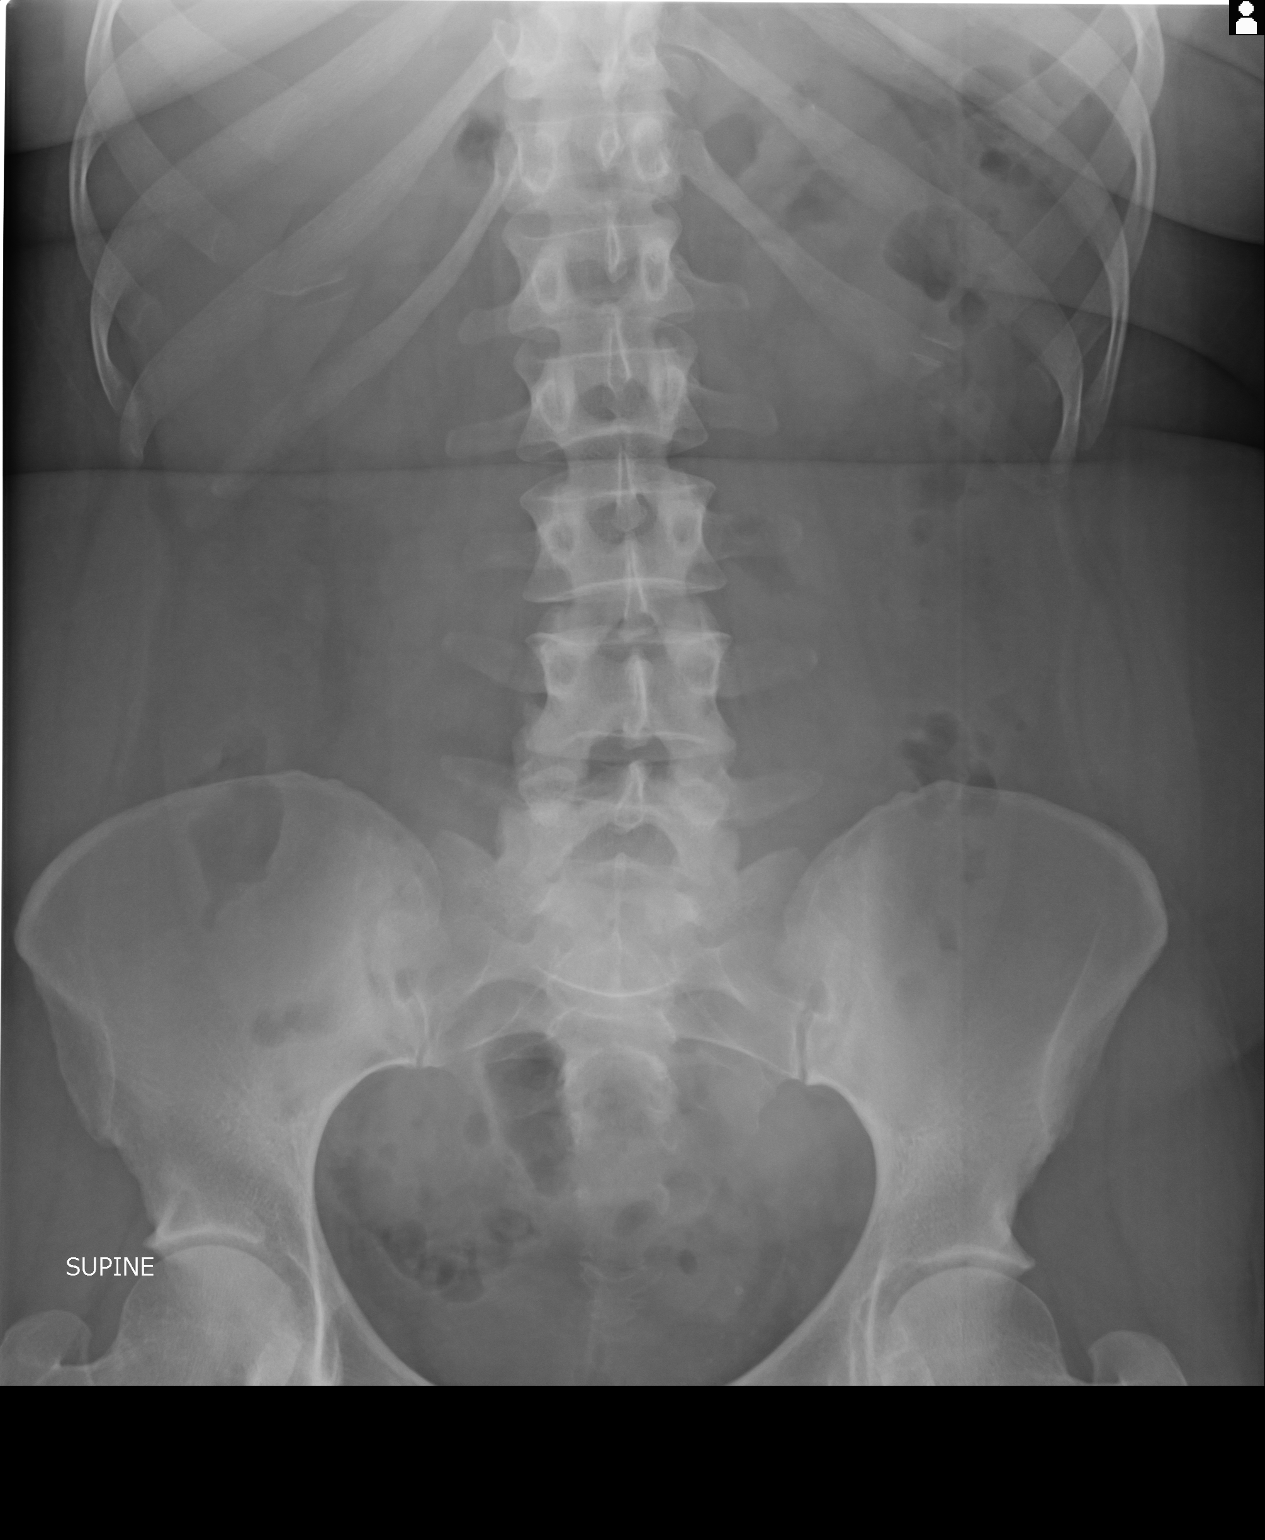

[3 of 3 positions shown; findings below may reference images not displayed]

FINDINGS: There is normal bowel gas pattern. No free air. No organomegaly or
suspicious calcification. No acute bony abnormality. Scoliosis in
the thoracolumbar spine.
IMPRESSION: No acute findings.

## 2017-01-05 ENCOUNTER — Encounter: Payer: Self-pay | Admitting: Gynecology

## 2020-04-08 ENCOUNTER — Ambulatory Visit (INDEPENDENT_AMBULATORY_CARE_PROVIDER_SITE_OTHER): Payer: BC Managed Care – PPO

## 2020-04-08 ENCOUNTER — Encounter: Payer: Self-pay | Admitting: Podiatry

## 2020-04-08 ENCOUNTER — Other Ambulatory Visit: Payer: Self-pay | Admitting: Podiatry

## 2020-04-08 ENCOUNTER — Ambulatory Visit: Payer: BC Managed Care – PPO | Admitting: Podiatry

## 2020-04-08 ENCOUNTER — Other Ambulatory Visit: Payer: Self-pay

## 2020-04-08 VITALS — BP 140/88 | HR 71 | Temp 93.7°F | Resp 16 | Ht 62.0 in | Wt 230.0 lb

## 2020-04-08 DIAGNOSIS — M2142 Flat foot [pes planus] (acquired), left foot: Secondary | ICD-10-CM

## 2020-04-08 DIAGNOSIS — M216X2 Other acquired deformities of left foot: Secondary | ICD-10-CM

## 2020-04-08 DIAGNOSIS — M25572 Pain in left ankle and joints of left foot: Secondary | ICD-10-CM

## 2020-04-08 DIAGNOSIS — M2141 Flat foot [pes planus] (acquired), right foot: Secondary | ICD-10-CM

## 2020-04-08 DIAGNOSIS — M7732 Calcaneal spur, left foot: Secondary | ICD-10-CM

## 2020-04-08 DIAGNOSIS — B351 Tinea unguium: Secondary | ICD-10-CM | POA: Diagnosis not present

## 2020-04-08 DIAGNOSIS — M7731 Calcaneal spur, right foot: Secondary | ICD-10-CM

## 2020-04-08 DIAGNOSIS — M79672 Pain in left foot: Secondary | ICD-10-CM | POA: Diagnosis not present

## 2020-04-08 DIAGNOSIS — M21862 Other specified acquired deformities of left lower leg: Secondary | ICD-10-CM

## 2020-04-08 DIAGNOSIS — M216X1 Other acquired deformities of right foot: Secondary | ICD-10-CM | POA: Diagnosis not present

## 2020-04-08 DIAGNOSIS — M722 Plantar fascial fibromatosis: Secondary | ICD-10-CM

## 2020-04-08 DIAGNOSIS — M21861 Other specified acquired deformities of right lower leg: Secondary | ICD-10-CM

## 2020-04-08 DIAGNOSIS — M79671 Pain in right foot: Secondary | ICD-10-CM | POA: Diagnosis not present

## 2020-04-08 DIAGNOSIS — M25571 Pain in right ankle and joints of right foot: Secondary | ICD-10-CM

## 2020-04-08 NOTE — Addendum Note (Signed)
Addended by: Celene Skeen A on: 04/08/2020 12:29 PM   Modules accepted: Orders

## 2020-04-08 NOTE — Progress Notes (Signed)
Subjective:  Patient ID: Theresa Miranda, female    DOB: 05-08-1971,  MRN: [id number]  Chief Complaint  Patient presents with  . Nail Problem    Left foot; Toes 1-5; nail discoloration & thickened nails; pt stated, "I have used Ciclopirox and that did not help"; xyrs  . Ankle Pain    Bilateral; both sides; pt stated, "I have swelling in both my ankles; this is making the back of my calf hurt; ankles hurt"; x1 yr  . Foot Pain    Bilateral; bottom of heels; pt stated, "I had Plantar Fasciitis surgery done by Dr. Valentina Lucks in [place] 8 yrs ago"; x3 yrs    49 y.o. female presents with the above complaint. History confirmed with patient.  She previously had surgery with Dr. Valentina Lucks for a plantar fascial release on the right side about a years ago.  The pain is starting to make her last couple months.  She also notes anterior lateral ankle pain and swelling.  She has been having left calf pain as well.  Objective:  Physical Exam: warm, good capillary refill, no trophic changes or ulcerative lesions, normal DP and PT pulses and normal sensory exam.  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  She has pain on palpation of the sinus tarsi and anterolateral ankle.  Gastrocnemius equinus is present on both sides with a positive Silfverskiold test   Radiographs: X-ray of both feet: Moderate to severe pes planovalgus with increase talar declination, decreased calcaneal inclination incongruity of Mary's angle and kites angle on both the AP and lateral view.  Large heel spurs present inferiorly.  No posterior enthesophytes noted.  Ankle and subtalar joint spaces are maintained without arthrosis or associated spurring.  Assessment:   1. Pain in both feet   2. Acute bilateral ankle pain   3. Plantar fasciitis   4. Onychomycosis    5. Pes planus of both feet   6. Gastrocnemius equinus of left lower extremity   7. Gastrocnemius equinus of right lower extremity   8. Calcaneal spur of left foot   9. Heel spur, right      Plan:  Patient was evaluated and treated and all questions answered.  -Discussed with her the etiology and treatment options for all the conditions.  I believe this is all secondary to moderate to severe flexible pes planovalgus with posterior soft tissue equinus.  We discussed surgical nonsurgical treatment options including injection therapy, supporting the deformity with bracing and orthotics, as well as surgical treatment options with flatfoot reconstruction.  -X-rays reviewed as above -Rx for Custom Molded Orthotics  -XR reviewed with patient -Educated patient on stretching and icing of the affected limb -Night splint dispensed -Plantar fascial brace dispensed -Injection delivered to the plantar fascia of both feet.  -If she is not improving, will consider MRI for the right side in the future.  She has had a plantar fascial is 4 but there could be residual scar tissue, nerve entrapment, or edema and impingement of the large heel spurs.  After sterile prep with povidone-iodine solution and alcohol, the bilateral heel was injected with 0.5cc 2% xylocaine plain, 0.5cc 0.5% marcaine plain, 5mg  triamcinolone acetonide, and 2mg  dexamethasone was injected along the medial plantar fascia at the insertion on the plantar calcaneus. The patient tolerated the procedure well without complication.  -  For her nail discoloration the left second and third toenails were taken as a nail fungal sample and we will culture this.  She is tried ciclopirox without relief.  We will consider terbinafine if positive for onychomycosis.  Return in about 6 weeks (around 05/20/2020) for recheck plantar fasciitis & Flat feet.

## 2020-04-08 NOTE — Patient Instructions (Signed)

## 2020-04-24 ENCOUNTER — Encounter: Payer: Self-pay | Admitting: Podiatry

## 2020-04-24 MED ORDER — TERBINAFINE HCL 250 MG PO TABS: 250.0000 mg | ORAL_TABLET | Freq: Every day | ORAL | 0 refills | Status: AC

## 2020-05-01 ENCOUNTER — Other Ambulatory Visit: Payer: Self-pay

## 2020-05-01 ENCOUNTER — Ambulatory Visit (INDEPENDENT_AMBULATORY_CARE_PROVIDER_SITE_OTHER): Payer: BC Managed Care – PPO | Admitting: Orthotics

## 2020-05-01 DIAGNOSIS — M79672 Pain in left foot: Secondary | ICD-10-CM

## 2020-05-01 DIAGNOSIS — M79671 Pain in right foot: Secondary | ICD-10-CM | POA: Diagnosis not present

## 2020-05-01 DIAGNOSIS — M2142 Flat foot [pes planus] (acquired), left foot: Secondary | ICD-10-CM

## 2020-05-01 DIAGNOSIS — M2141 Flat foot [pes planus] (acquired), right foot: Secondary | ICD-10-CM

## 2020-05-01 DIAGNOSIS — M722 Plantar fascial fibromatosis: Secondary | ICD-10-CM

## 2020-05-01 NOTE — Progress Notes (Signed)
Patient is being seen today for f/o to address congential pes planus/pes planovalgus. Patient is active youth and demonstrates over pronation in gait, prominent medially shifted talus, and collapse of medial column.  Goals are RF stability, longitudinal arch support, decrease in pronation, and ease of discomfort in mobility related activities.   

## 2020-05-22 ENCOUNTER — Ambulatory Visit: Payer: BC Managed Care – PPO | Admitting: Podiatry

## 2020-05-27 ENCOUNTER — Other Ambulatory Visit: Payer: Self-pay

## 2020-05-27 ENCOUNTER — Ambulatory Visit (INDEPENDENT_AMBULATORY_CARE_PROVIDER_SITE_OTHER): Payer: BC Managed Care – PPO | Admitting: Podiatry

## 2020-05-27 ENCOUNTER — Encounter: Payer: Self-pay | Admitting: Podiatry

## 2020-05-27 DIAGNOSIS — M21862 Other specified acquired deformities of left lower leg: Secondary | ICD-10-CM

## 2020-05-27 DIAGNOSIS — M79671 Pain in right foot: Secondary | ICD-10-CM

## 2020-05-27 DIAGNOSIS — M216X2 Other acquired deformities of left foot: Secondary | ICD-10-CM

## 2020-05-27 DIAGNOSIS — M216X1 Other acquired deformities of right foot: Secondary | ICD-10-CM

## 2020-05-27 DIAGNOSIS — M722 Plantar fascial fibromatosis: Secondary | ICD-10-CM | POA: Diagnosis not present

## 2020-05-27 DIAGNOSIS — M21861 Other specified acquired deformities of right lower leg: Secondary | ICD-10-CM

## 2020-05-27 DIAGNOSIS — M79672 Pain in left foot: Secondary | ICD-10-CM

## 2020-05-27 NOTE — Progress Notes (Signed)
  Subjective:  Patient ID: Theresa Miranda, female    DOB: September 03, 1970,  MRN: [id number]  Chief Complaint  Patient presents with  . Flat Foot    49 y.o. female returns with the above complaint. History confirmed with patient.  Has been wearing the plantar fascia brace. She has had some improvement in pain but not much. Injection did not help more than a couple weeks. She is taking the terbinafine for the nail fungus.  Objective:  Physical Exam: warm, good capillary refill, no trophic changes or ulcerative lesions, normal DP and PT pulses and normal sensory exam.  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  She has mild pain on palpation of the sinus tarsi and anterolateral ankle.  Gastrocnemius equinus is present on both sides with a positive Silfverskiold test     Assessment:   1. Plantar fasciitis of right foot   2. Plantar fasciitis of left foot   3. Gastrocnemius equinus of right lower extremity   4. Gastrocnemius equinus of left lower extremity   5. Right foot pain   6. Left foot pain      Plan:  Patient was evaluated and treated and all questions answered.  -Has not had much improvement. Concerned she has residual plantar fascial attachments with inflammation vs scar tissue formation at sites of previous release. Would like to order advanced imaging to evaluate this in detail. Bilateral heel MRIs ordered (if need be, start with R first). -Could consider open revision release of PF and heel spur resection +/- gastroc recession to address equinus. -In the interim I think she would benefit from physical therapy. Referral sent to [facility].   Return in about 6 weeks (around 07/08/2020).

## 2020-05-29 ENCOUNTER — Other Ambulatory Visit: Payer: BC Managed Care – PPO | Admitting: Orthotics

## 2020-06-10 ENCOUNTER — Other Ambulatory Visit: Payer: Self-pay

## 2020-06-10 ENCOUNTER — Ambulatory Visit: Payer: BC Managed Care – PPO | Admitting: Orthotics

## 2020-06-10 DIAGNOSIS — M722 Plantar fascial fibromatosis: Secondary | ICD-10-CM

## 2020-06-10 NOTE — Progress Notes (Signed)
Patient came in today to p/up functional foot orthotics.   The orthotics were assessed to both fit and function.  The F/O addressed the biomechanical issues/pathologies as intended, offering good longitudinal arch support, proper offloading, and foot support. There weren't any signs of discomfort or irritation.  The F/O fit properly in footwear with minimal trimming/adjustments. 

## 2020-06-11 ENCOUNTER — Ambulatory Visit
Admission: RE | Admit: 2020-06-11 | Discharge: 2020-06-11 | Disposition: A | Discharge status: Home / Self Care | Payer: BC Managed Care – PPO | Source: Ambulatory Visit | Attending: Podiatry | Admitting: Podiatry

## 2020-06-11 DIAGNOSIS — M79672 Pain in left foot: Secondary | ICD-10-CM

## 2020-06-11 DIAGNOSIS — M79671 Pain in right foot: Secondary | ICD-10-CM

## 2020-07-08 ENCOUNTER — Encounter: Payer: Self-pay | Admitting: Podiatry

## 2020-07-08 ENCOUNTER — Other Ambulatory Visit: Payer: Self-pay

## 2020-07-08 ENCOUNTER — Ambulatory Visit (INDEPENDENT_AMBULATORY_CARE_PROVIDER_SITE_OTHER): Payer: BC Managed Care – PPO | Admitting: Podiatry

## 2020-07-08 DIAGNOSIS — M21862 Other specified acquired deformities of left lower leg: Secondary | ICD-10-CM

## 2020-07-08 DIAGNOSIS — M7661 Achilles tendinitis, right leg: Secondary | ICD-10-CM | POA: Diagnosis not present

## 2020-07-08 DIAGNOSIS — M722 Plantar fascial fibromatosis: Secondary | ICD-10-CM | POA: Diagnosis not present

## 2020-07-08 DIAGNOSIS — M216X1 Other acquired deformities of right foot: Secondary | ICD-10-CM

## 2020-07-08 DIAGNOSIS — M7662 Achilles tendinitis, left leg: Secondary | ICD-10-CM

## 2020-07-08 DIAGNOSIS — M216X2 Other acquired deformities of left foot: Secondary | ICD-10-CM

## 2020-07-08 DIAGNOSIS — M21861 Other specified acquired deformities of right lower leg: Secondary | ICD-10-CM

## 2020-07-08 MED ORDER — METHYLPREDNISOLONE 4 MG PO TBPK: ORAL_TABLET | ORAL | 0 refills | Status: AC

## 2020-07-08 NOTE — Progress Notes (Signed)
Subjective:  Patient ID: Theresa Miranda, female    DOB: 1971-04-28,  MRN: [id number]  Chief Complaint  Patient presents with  . Plantar Fasciitis    Pt stated that she is still having pain and swelling, is doing physical therapy and feels like it is helping some     49 y.o. female returns with the above complaint. History confirmed with patient.  Has been wearing the plantar fascia brace. PT has been helpful. L side has improved a lot more than R side. Now having pain in Achilles bilaterally too  Objective:  Physical Exam: warm, good capillary refill, no trophic changes or ulcerative lesions, normal DP and PT pulses and normal sensory exam.  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   Gastrocnemius equinus is present on both sides with a positive Silfverskiold test  Study Result  Narrative & Impression  CLINICAL DATA:  Chronic bilateral ankle pain and swelling. No known injury.  EXAM: MR OF THE RIGHT HEEL WITHOUT CONTRAST  TECHNIQUE: Multiplanar, multisequence MR imaging of the ankle was performed. No intravenous contrast was administered.  COMPARISON:  None.  FINDINGS: TENDONS  Peroneal: Intact.  Posteromedial: Intact.  Anterior: Intact.  Achilles: Intact.  Plantar Fascia: Intact.  No evidence of plantar fasciitis.  LIGAMENTS  Lateral: Intact.  Medial: Intact.  CARTILAGE  Ankle Joint: Normal. No joint effusion or osteochondral lesion of the talar dome.  Subtalar Joints/Sinus Tarsi: Normal.  Bones: Small subchondral cysts are seen about the third tarsometatarsal joint. Bone marrow signal is otherwise normal.  Other: Scattered subcutaneous edema is seen about the ankle and foot.  IMPRESSION: Negative for plantar fasciitis, tendon or ligament tear. No  acute finding.  Mild osteoarthritis third tarsometatarsal joint.  Subcutaneous edema about the ankle foot most consistent with dependent change.   Electronically Signed   By: Inge Rise M.D.   On: 06/12/2020 12:40    Study Result  Narrative & Impression  CLINICAL DATA:  Chronic bilateral ankle pain and swelling. No known injury.  EXAM: MR OF THE LEFT HEEL WITHOUT CONTRAST  TECHNIQUE: Multiplanar, multisequence MR imaging of the ankle was performed. No intravenous contrast was administered.  COMPARISON:  Plain films of the left ankle 04/08/2020.  FINDINGS: TENDONS  Peroneal: Intact.  Posteromedial: Intact.  Anterior: Intact.  Achilles: Intact.  Plantar Fascia: Intact.  No evidence of plantar fasciitis.  LIGAMENTS  Lateral: Intact.  Medial: Intact.  CARTILAGE  Ankle Joint: Normal. No joint effusion or osteochondral lesion of the talar dome.  Subtalar Joints/Sinus Tarsi: Normal.  Bones: Minimal subchondral cyst formation is seen about the right second tarsometatarsal joint. Marrow signal is otherwise normal.  Other: Scattered subcutaneous edema about the ankle and foot is likely due to dependent change.  IMPRESSION: No acute or focal abnormality. Negative for plantar fasciitis or tendon or ligament tear.  Minimal osteoarthritis second TMT joint.  Scattered subcutaneous edema most consistent with dependent change.   Electronically Signed   By: Inge Rise M.D.   On: 06/12/2020 12:36      Assessment:   1. Plantar fasciitis of right foot   2. Plantar fasciitis of left foot   3. Gastrocnemius equinus of right lower extremity   4. Gastrocnemius equinus of left lower extremity   5. Achilles tendinitis of both lower extremities  Plan:  Patient was evaluated and treated and all questions answered.  -MRI reviewed with patient. I reviewed the images independently, I do not see any re-attachment,  scar formation, or evidence of nerve entrapment on the R side. Normal appearing fascia on the L side. -Continue PT -Repeat injection today in the right heel with increased steroid dose of 4mg  dexamethasone, 10mg  kenalog and 1cc 2% lidocaine plain -Methylprednisolone dose pak prescribed, hopefully this will improve her symptoms -If we can get the LLE to improve to near 100% may consider CAM boot on R -We could consider gastrocnemius recession to alleviate plantar pressure and strain on the PF if not improving with PT/stretching but I think she prefers to avoid further surgery for now understandably  Return in about 2 months (around 09/07/2020).

## 2020-07-08 NOTE — Patient Instructions (Addendum)
Look for Voltaren gel at the pharmacy over the counter or online (also known as diclofenac 1% gel). Apply to the painful areas 3-4x daily with the supplied dosing card. Allow to dry for 10 minutes before going into socks/shoes   Continue physical therapy and home stretching and exercise program

## 2020-09-09 ENCOUNTER — Ambulatory Visit: Payer: BC Managed Care – PPO | Admitting: Podiatry

## 2021-10-18 IMAGING — MR MR HEEL *R* W/O CM
5 series · 40 of 40 positions shown · non-contrast
Comparison: None.

CLINICAL DATA: Chronic bilateral ankle pain and swelling. No known
injury.

EXAM:
MR OF THE RIGHT HEEL WITHOUT CONTRAST
TECHNIQUE: Multiplanar, multisequence MR imaging of the ankle was performed. No
intravenous contrast was administered.

[Series 3: T2 fat-sat · axial · 3.0mm · 0.50mm/px · z∈[-75,+64]mm · 10 of 36 slices shown (1 of 2)]
[im 1/36]
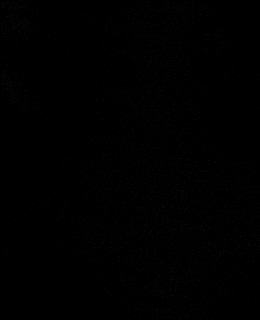
[im 4/36]
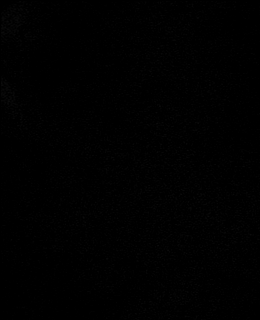
[im 8/36]
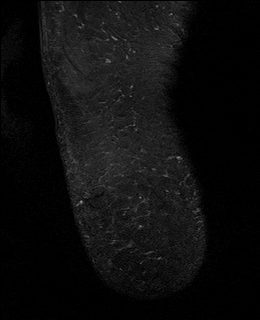
[im 12/36]
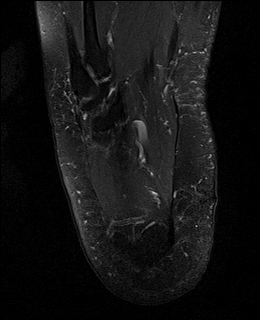
[im 16/36]
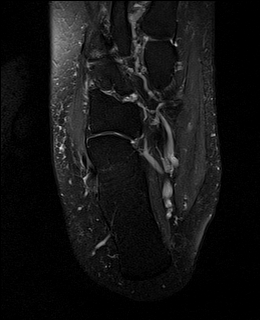
[im 20/36]
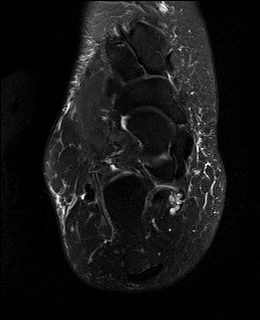
[im 24/36]
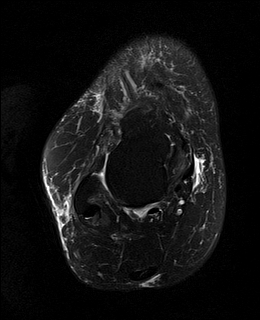
[im 28/36]
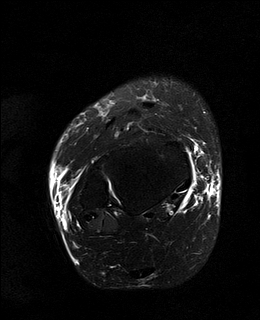
[im 32/36]
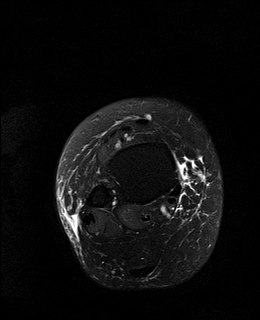
[im 36/36]
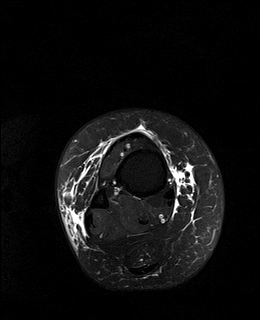

[Series 4: PD fat-sat · axial · 3.5mm · 0.47mm/px · z∈[-69,+57]mm · 8 of 30 slices shown]
[im 1/30]
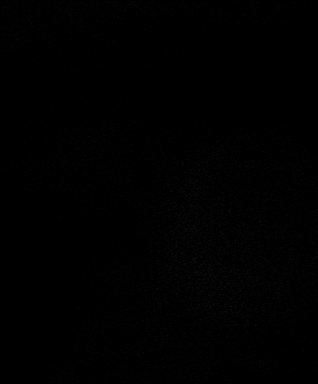
[im 5/30]
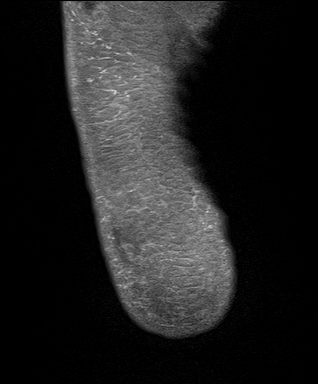
[im 9/30]
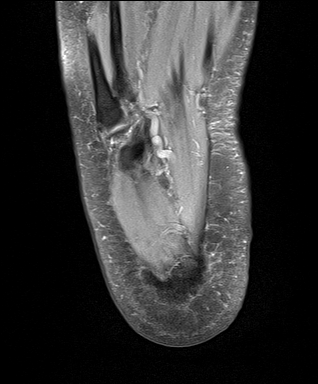
[im 13/30]
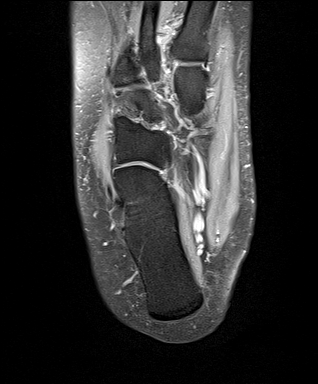
[im 17/30]
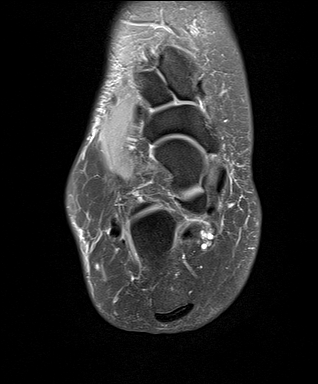
[im 21/30]
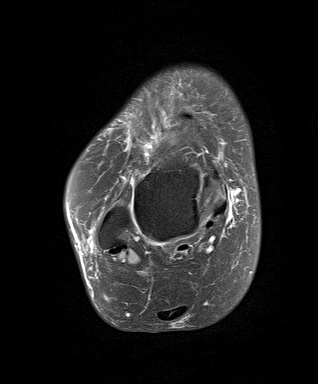
[im 25/30]
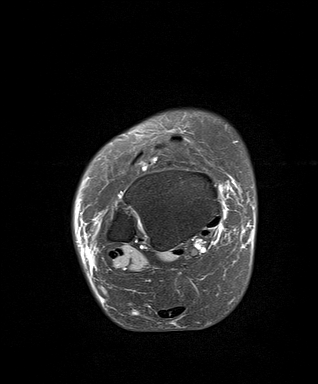
[im 30/30]
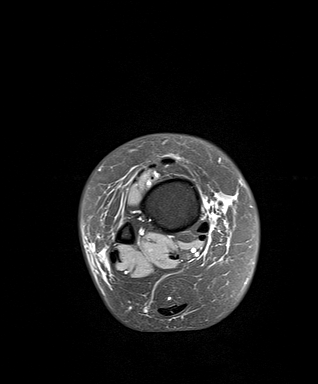

[Series 5: T2 fat-sat · coronal · 3.0mm · 0.62mm/px · 10 of 39 slices shown (2 of 2)]
[im 1/39]
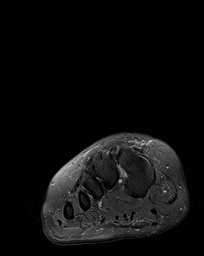
[im 5/39]
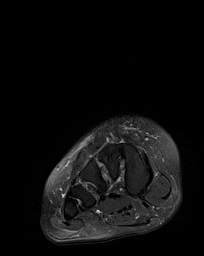
[im 9/39]
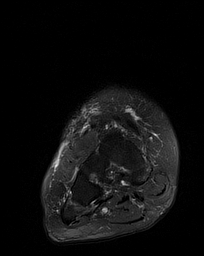
[im 13/39]
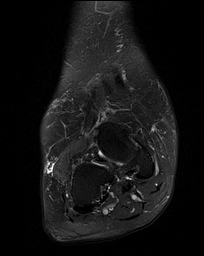
[im 17/39]
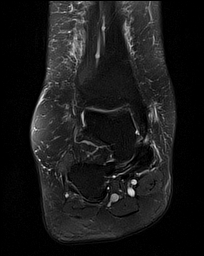
[im 22/39]
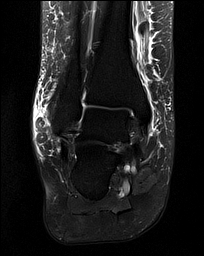
[im 26/39]
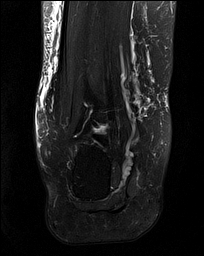
[im 30/39]
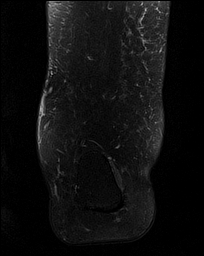
[im 34/39]
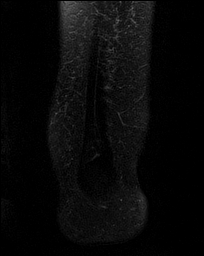
[im 39/39]
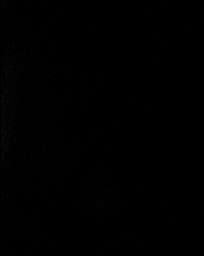

[Series 6: T1 · sagittal · 4.0mm · 0.56mm/px · 6 of 22 slices shown]
[im 1/22]
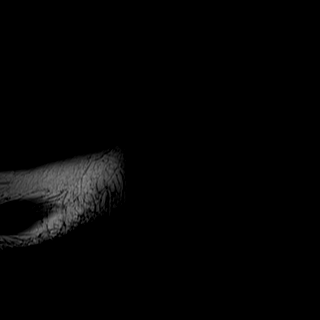
[im 5/22]
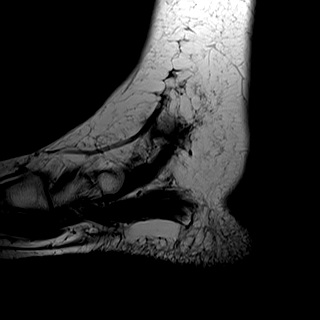
[im 9/22]
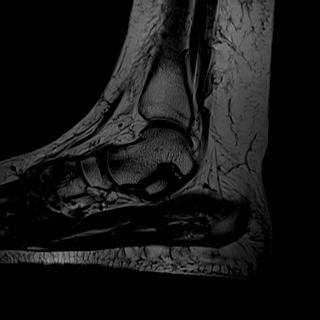
[im 13/22]
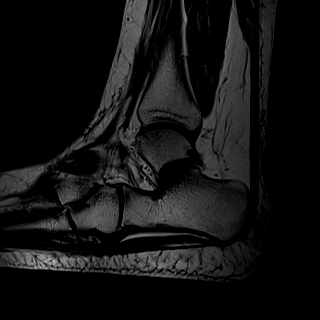
[im 17/22]
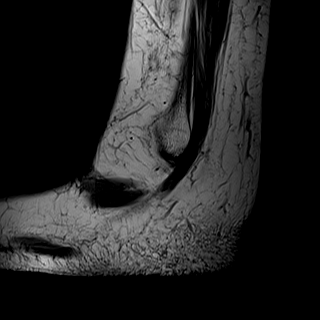
[im 22/22]
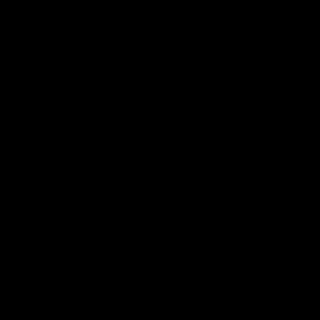

[Series 7: STIR · sagittal · 4.0mm · 0.70mm/px · 6 of 22 slices shown]
[im 1/22]
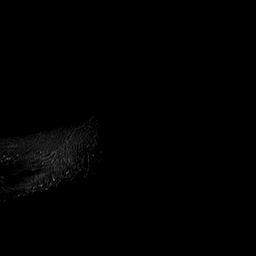
[im 5/22]
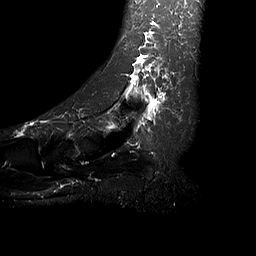
[im 9/22]
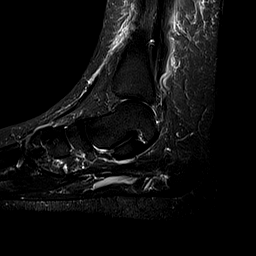
[im 13/22]
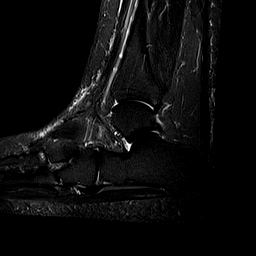
[im 17/22]
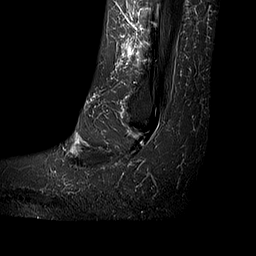
[im 22/22]
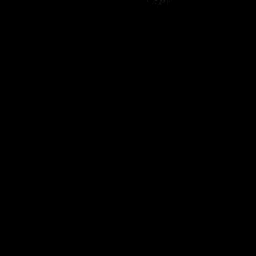

[40 of 40 positions shown; findings below may reference images not displayed]

FINDINGS: TENDONS

Peroneal: Intact.

Posteromedial: Intact.

Anterior: Intact.

Achilles: Intact.

Plantar Fascia: Intact.  No evidence of plantar fasciitis.

LIGAMENTS

Lateral: Intact.

Medial: Intact.

CARTILAGE

Ankle Joint: Normal. No joint effusion or osteochondral lesion of
the talar dome.

Subtalar Joints/Sinus Tarsi: Normal.

Bones: Small subchondral cysts are seen about the third
tarsometatarsal joint. Bone marrow signal is otherwise normal.

Other: Scattered subcutaneous edema is seen about the ankle and
foot.
IMPRESSION: Negative for plantar fasciitis, tendon or ligament tear. No acute
finding.

Mild osteoarthritis third tarsometatarsal joint.

Subcutaneous edema about the ankle foot most consistent with
dependent change.
